# Patient Record
Sex: Female | Born: 1955 | Race: Black or African American | Hispanic: No | Marital: Married | Smoking: Current every day smoker
Health system: Southern US, Community
[De-identification: ages and names within clinical notes are randomized; demographics above are authoritative.]

---

## 2002-03-24 ENCOUNTER — Emergency Department (HOSPITAL_COMMUNITY): Admission: EM | Admit: 2002-03-24 | Discharge: 2002-03-24 | Payer: Self-pay | Admitting: Emergency Medicine

## 2002-03-24 ENCOUNTER — Encounter: Payer: Self-pay | Admitting: Emergency Medicine

## 2002-05-08 ENCOUNTER — Ambulatory Visit (HOSPITAL_COMMUNITY): Admission: RE | Admit: 2002-05-08 | Discharge: 2002-05-08 | Payer: Self-pay | Admitting: Family Medicine

## 2019-02-10 ENCOUNTER — Other Ambulatory Visit: Payer: Self-pay

## 2019-02-10 ENCOUNTER — Inpatient Hospital Stay (HOSPITAL_COMMUNITY): Payer: BC Managed Care – PPO

## 2019-02-10 ENCOUNTER — Emergency Department (HOSPITAL_COMMUNITY): Payer: BC Managed Care – PPO

## 2019-02-10 ENCOUNTER — Inpatient Hospital Stay (HOSPITAL_COMMUNITY)
Admission: EM | Admit: 2019-02-10 | Discharge: 2019-03-16 | DRG: 025 | Disposition: E | Payer: BC Managed Care – PPO | Attending: Neurological Surgery | Admitting: Neurological Surgery

## 2019-02-10 ENCOUNTER — Encounter (HOSPITAL_COMMUNITY): Payer: Self-pay | Admitting: Emergency Medicine

## 2019-02-10 DIAGNOSIS — E43 Unspecified severe protein-calorie malnutrition: Secondary | ICD-10-CM | POA: Insufficient documentation

## 2019-02-10 DIAGNOSIS — H5704 Mydriasis: Secondary | ICD-10-CM | POA: Diagnosis present

## 2019-02-10 DIAGNOSIS — G939 Disorder of brain, unspecified: Secondary | ICD-10-CM

## 2019-02-10 DIAGNOSIS — Z66 Do not resuscitate: Secondary | ICD-10-CM | POA: Diagnosis present

## 2019-02-10 DIAGNOSIS — R200 Anesthesia of skin: Secondary | ICD-10-CM | POA: Diagnosis present

## 2019-02-10 DIAGNOSIS — R739 Hyperglycemia, unspecified: Secondary | ICD-10-CM | POA: Diagnosis not present

## 2019-02-10 DIAGNOSIS — G9752 Postprocedural hemorrhage and hematoma of a nervous system organ or structure following other procedure: Secondary | ICD-10-CM | POA: Diagnosis not present

## 2019-02-10 DIAGNOSIS — G9349 Other encephalopathy: Secondary | ICD-10-CM | POA: Diagnosis not present

## 2019-02-10 DIAGNOSIS — M21371 Foot drop, right foot: Secondary | ICD-10-CM | POA: Diagnosis not present

## 2019-02-10 DIAGNOSIS — Z8679 Personal history of other diseases of the circulatory system: Secondary | ICD-10-CM

## 2019-02-10 DIAGNOSIS — J95821 Acute postprocedural respiratory failure: Secondary | ICD-10-CM | POA: Diagnosis not present

## 2019-02-10 DIAGNOSIS — D1800 Hemangioma unspecified site: Secondary | ICD-10-CM | POA: Diagnosis present

## 2019-02-10 DIAGNOSIS — D62 Acute posthemorrhagic anemia: Secondary | ICD-10-CM | POA: Diagnosis not present

## 2019-02-10 DIAGNOSIS — G9389 Other specified disorders of brain: Secondary | ICD-10-CM

## 2019-02-10 DIAGNOSIS — R030 Elevated blood-pressure reading, without diagnosis of hypertension: Secondary | ICD-10-CM | POA: Diagnosis not present

## 2019-02-10 DIAGNOSIS — C801 Malignant (primary) neoplasm, unspecified: Secondary | ICD-10-CM | POA: Diagnosis present

## 2019-02-10 DIAGNOSIS — J9600 Acute respiratory failure, unspecified whether with hypoxia or hypercapnia: Secondary | ICD-10-CM

## 2019-02-10 DIAGNOSIS — C7931 Secondary malignant neoplasm of brain: Principal | ICD-10-CM | POA: Diagnosis present

## 2019-02-10 DIAGNOSIS — Z818 Family history of other mental and behavioral disorders: Secondary | ICD-10-CM

## 2019-02-10 DIAGNOSIS — Z4659 Encounter for fitting and adjustment of other gastrointestinal appliance and device: Secondary | ICD-10-CM

## 2019-02-10 DIAGNOSIS — Z1159 Encounter for screening for other viral diseases: Secondary | ICD-10-CM

## 2019-02-10 DIAGNOSIS — R531 Weakness: Secondary | ICD-10-CM | POA: Diagnosis not present

## 2019-02-10 DIAGNOSIS — Z515 Encounter for palliative care: Secondary | ICD-10-CM

## 2019-02-10 DIAGNOSIS — F17210 Nicotine dependence, cigarettes, uncomplicated: Secondary | ICD-10-CM | POA: Diagnosis present

## 2019-02-10 DIAGNOSIS — C7800 Secondary malignant neoplasm of unspecified lung: Secondary | ICD-10-CM | POA: Diagnosis present

## 2019-02-10 DIAGNOSIS — G936 Cerebral edema: Secondary | ICD-10-CM | POA: Diagnosis present

## 2019-02-10 DIAGNOSIS — I10 Essential (primary) hypertension: Secondary | ICD-10-CM | POA: Diagnosis not present

## 2019-02-10 DIAGNOSIS — J9690 Respiratory failure, unspecified, unspecified whether with hypoxia or hypercapnia: Secondary | ICD-10-CM

## 2019-02-10 DIAGNOSIS — J439 Emphysema, unspecified: Secondary | ICD-10-CM | POA: Diagnosis present

## 2019-02-10 DIAGNOSIS — T380X5A Adverse effect of glucocorticoids and synthetic analogues, initial encounter: Secondary | ICD-10-CM

## 2019-02-10 DIAGNOSIS — E876 Hypokalemia: Secondary | ICD-10-CM | POA: Diagnosis not present

## 2019-02-10 DIAGNOSIS — E878 Other disorders of electrolyte and fluid balance, not elsewhere classified: Secondary | ICD-10-CM | POA: Diagnosis not present

## 2019-02-10 DIAGNOSIS — G9340 Encephalopathy, unspecified: Secondary | ICD-10-CM

## 2019-02-10 DIAGNOSIS — D496 Neoplasm of unspecified behavior of brain: Secondary | ICD-10-CM | POA: Diagnosis present

## 2019-02-10 DIAGNOSIS — Z9911 Dependence on respirator [ventilator] status: Secondary | ICD-10-CM

## 2019-02-10 DIAGNOSIS — Z9889 Other specified postprocedural states: Secondary | ICD-10-CM

## 2019-02-10 DIAGNOSIS — G935 Compression of brain: Secondary | ICD-10-CM | POA: Diagnosis present

## 2019-02-10 LAB — COMPREHENSIVE METABOLIC PANEL
ALT: 17 U/L (ref 0–44)
AST: 22 U/L (ref 15–41)
Albumin: 3.3 g/dL — ABNORMAL LOW (ref 3.5–5.0)
Alkaline Phosphatase: 62 U/L (ref 38–126)
Anion gap: 10 (ref 5–15)
BUN: 8 mg/dL (ref 8–23)
CO2: 22 mmol/L (ref 22–32)
Calcium: 8.9 mg/dL (ref 8.9–10.3)
Chloride: 109 mmol/L (ref 98–111)
Creatinine, Ser: 0.64 mg/dL (ref 0.44–1.00)
GFR calc Af Amer: >60 mL/min (ref >60)
GFR calc non Af Amer: >60 mL/min (ref >60)
Glucose, Bld: 124 mg/dL — ABNORMAL HIGH (ref 70–99)
Potassium: 3.5 mmol/L (ref 3.5–5.1)
Sodium: 141 mmol/L (ref 135–145)
Total Bilirubin: 0.7 mg/dL (ref 0.3–1.2)
Total Protein: 6.9 g/dL (ref 6.5–8.1)

## 2019-02-10 LAB — PROTIME-INR
INR: 1.1 (ref 0.8–1.2)
Prothrombin Time: 14.5 seconds (ref 11.4–15.2)

## 2019-02-10 LAB — CBC
HCT: 42.4 % (ref 36.0–46.0)
Hemoglobin: 14 g/dL (ref 12.0–15.0)
MCH: 32.8 pg (ref 26.0–34.0)
MCHC: 33 g/dL (ref 30.0–36.0)
MCV: 99.3 fL (ref 80.0–100.0)
Platelets: 328 10*3/uL (ref 150–400)
RBC: 4.27 MIL/uL (ref 3.87–5.11)
RDW: 14.6 % (ref 11.5–15.5)
WBC: 8.3 10*3/uL (ref 4.0–10.5)
nRBC: 0 % (ref 0.0–0.2)

## 2019-02-10 LAB — DIFFERENTIAL
Abs Immature Granulocytes: 0.03 10*3/uL (ref 0.00–0.07)
Basophils Absolute: 0.1 10*3/uL (ref 0.0–0.1)
Basophils Relative: 1 %
Eosinophils Absolute: 0.2 10*3/uL (ref 0.0–0.5)
Eosinophils Relative: 3 %
Immature Granulocytes: 0 %
Lymphocytes Relative: 26 %
Lymphs Abs: 2.2 10*3/uL (ref 0.7–4.0)
Monocytes Absolute: 0.8 10*3/uL (ref 0.1–1.0)
Monocytes Relative: 9 %
Neutro Abs: 5.1 10*3/uL (ref 1.7–7.7)
Neutrophils Relative %: 61 %

## 2019-02-10 LAB — URINALYSIS, ROUTINE W REFLEX MICROSCOPIC
Bilirubin Urine: NEGATIVE
Glucose, UA: NEGATIVE mg/dL
Hgb urine dipstick: NEGATIVE
Ketones, ur: NEGATIVE mg/dL
Leukocytes,Ua: NEGATIVE
Nitrite: NEGATIVE
Protein, ur: NEGATIVE mg/dL
Specific Gravity, Urine: 1.014 (ref 1.005–1.030)
pH: 7 (ref 5.0–8.0)

## 2019-02-10 LAB — ETHANOL: Alcohol, Ethyl (B): <10 mg/dL (ref <10)

## 2019-02-10 LAB — RAPID URINE DRUG SCREEN, HOSP PERFORMED
Amphetamines: NOT DETECTED
Barbiturates: NOT DETECTED
Benzodiazepines: NOT DETECTED
Cocaine: NOT DETECTED
Opiates: NOT DETECTED
Tetrahydrocannabinol: NOT DETECTED

## 2019-02-10 LAB — APTT: aPTT: 31 seconds (ref 24–36)

## 2019-02-10 MED ORDER — HEPARIN SODIUM (PORCINE) 5000 UNIT/ML IJ SOLN
5000.0000 [IU] | Freq: Three times a day (TID) | INTRAMUSCULAR | Status: DC
  Administered 2019-02-11 – 2019-02-14 (×7): 5000 [IU] via SUBCUTANEOUS
  Filled 2019-02-10 (×7): qty 1

## 2019-02-10 MED ORDER — SENNOSIDES-DOCUSATE SODIUM 8.6-50 MG PO TABS: 1.0000 | ORAL_TABLET | Freq: Every evening | ORAL | Status: DC | PRN

## 2019-02-10 MED ORDER — GADOBUTROL 1 MMOL/ML IV SOLN
4.5000 mL | Freq: Once | INTRAVENOUS | Status: AC | PRN
  Administered 2019-02-10: 4.5 mL via INTRAVENOUS

## 2019-02-10 MED ORDER — IOHEXOL 300 MG/ML  SOLN
100.0000 mL | Freq: Once | INTRAMUSCULAR | Status: AC | PRN
  Administered 2019-02-10: 100 mL via INTRAVENOUS

## 2019-02-10 MED ORDER — NICOTINE 21 MG/24HR TD PT24
21.0000 mg | MEDICATED_PATCH | Freq: Every day | TRANSDERMAL | Status: DC
  Administered 2019-02-10 – 2019-02-18 (×8): 21 mg via TRANSDERMAL
  Filled 2019-02-10 (×8): qty 1

## 2019-02-10 MED ORDER — PROMETHAZINE HCL 25 MG PO TABS: 12.5000 mg | ORAL_TABLET | Freq: Four times a day (QID) | ORAL | Status: DC | PRN

## 2019-02-10 MED ORDER — ACETAMINOPHEN 325 MG PO TABS: 650.0000 mg | ORAL_TABLET | Freq: Four times a day (QID) | ORAL | Status: DC | PRN

## 2019-02-10 MED ORDER — HEPARIN SODIUM (PORCINE) 5000 UNIT/ML IJ SOLN: 5000.0000 [IU] | Freq: Three times a day (TID) | INTRAMUSCULAR | Status: DC

## 2019-02-10 MED ORDER — ENOXAPARIN SODIUM 30 MG/0.3ML ~~LOC~~ SOLN
30.0000 mg | SUBCUTANEOUS | Status: DC
  Administered 2019-02-10: 30 mg via SUBCUTANEOUS
  Filled 2019-02-10: qty 0.3

## 2019-02-10 MED ORDER — ACETAMINOPHEN 650 MG RE SUPP: 650.0000 mg | Freq: Four times a day (QID) | RECTAL | Status: DC | PRN

## 2019-02-10 NOTE — ED Triage Notes (Signed)
Pt states she has been experiencing increased weakness especially in her right arm X 3 weeks .

## 2019-02-10 NOTE — ED Notes (Signed)
ED TO INPATIENT HANDOFF REPORT  ED Nurse Name and Phone #: Harriette Bouillon [phone]  S Name/Age/Gender Donna Delgado 63 y.o. female Room/Bed: 026C/026C  Code Status   Code Status: Full Code  Home/SNF/Other Home Patient oriented to: self, place, time and situation Is this baseline? Yes   Triage Complete: Triage complete  Chief Complaint Right sided weakness  Triage Note Pt states she has been experiencing increased weakness especially in her right arm X 3 weeks .   Allergies No Known Allergies  Level of Care/Admitting Diagnosis ED Disposition    ED Disposition Condition [facility] Area: [place] [[id number]]  Level of Care: Telemetry Medical [104]  Covid Evaluation: Screening Protocol (No Symptoms)  Diagnosis: Brain mass [694854]  Admitting Physician: Aldine Contes [[id number]]  Attending Physician: Aldine Contes [phone]  Estimated length of stay: past midnight tomorrow  Certification:: I certify this patient will need inpatient services for at least 2 midnights  PT Class (Do Not Modify): Inpatient [101]  PT Acc Code (Do Not Modify): Private [1]       B Medical/Surgery History History reviewed. No pertinent past medical history. History reviewed. No pertinent surgical history.   A IV Location/Drains/Wounds Patient Lines/Drains/Airways Status   Active Line/Drains/Airways    Name:   Placement date:   Placement time:   Site:   Days:   Peripheral IV 02/08/2019 Right Antecubital   01/20/2019    1033    Antecubital   less than 1          Intake/Output Last 24 hours No intake or output data in the 24 hours ending 01/24/2019 1519  Labs/Imaging Results for orders placed or performed during the hospital encounter of 01/26/2019 (from the past 48 hour(s))  Ethanol     Status: None   Collection Time: 01/15/2019 10:20 AM  Result Value Ref Range   Alcohol, Ethyl (B) <10 <10 mg/dL    Comment: (NOTE) Lowest detectable limit for serum  alcohol is 10 mg/dL. For medical purposes only. Performed at [facility], [place] [address] [place], [place] [address]   Protime-INR     Status: None   Collection Time: 02/12/2019 10:20 AM  Result Value Ref Range   Prothrombin Time 14.5 11.4 - 15.2 seconds   INR 1.1 0.8 - 1.2    Comment: (NOTE) INR goal varies based on device and disease states. Performed at [facility], [place] [address] [place], [place] [address]   APTT     Status: None   Collection Time: 02/09/2019 10:20 AM  Result Value Ref Range   aPTT 31 24 - 36 seconds    Comment: Performed at [facility] [address], [place], [place] [address]  CBC     Status: None   Collection Time: 01/17/2019 10:20 AM  Result Value Ref Range   WBC 8.3 4.0 - 10.5 K/uL   RBC 4.27 3.87 - 5.11 MIL/uL   Hemoglobin 14.0 12.0 - 15.0 g/dL   HCT 42.4 36.0 - 46.0 %   MCV 99.3 80.0 - 100.0 fL   MCH 32.8 26.0 - 34.0 pg   MCHC 33.0 30.0 - 36.0 g/dL   RDW 14.6 11.5 - 15.5 %   Platelets 328 150 - 400 K/uL   nRBC 0.0 0.0 - 0.2 %    Comment: Performed at [facility], [place] [address] [place], [place] [address]  Differential     Status: None  Collection Time: 01/22/2019 10:20 AM  Result Value Ref Range   Neutrophils Relative % 61 %   Neutro Abs 5.1 1.7 - 7.7 K/uL   Lymphocytes Relative 26 %   Lymphs Abs 2.2 0.7 - 4.0 K/uL   Monocytes Relative 9 %   Monocytes Absolute 0.8 0.1 - 1.0 K/uL   Eosinophils Relative 3 %   Eosinophils Absolute 0.2 0.0 - 0.5 K/uL   Basophils Relative 1 %   Basophils Absolute 0.1 0.0 - 0.1 K/uL   Immature Granulocytes 0 %   Abs Immature Granulocytes 0.03 0.00 - 0.07 K/uL    Comment: Performed at [facility] [address], [place], [place] [address]  Comprehensive metabolic panel     Status: Abnormal   Collection Time: 01/17/2019 10:20 AM  Result Value Ref Range   Sodium 141 135 - 145 mmol/L   Potassium 3.5 3.5 - 5.1 mmol/L   Chloride 109 98 - 111 mmol/L   CO2 22 22 - 32 mmol/L    Glucose, Bld 124 (H) 70 - 99 mg/dL   BUN 8 8 - 23 mg/dL   Creatinine, Ser 0.64 0.44 - 1.00 mg/dL   Calcium 8.9 8.9 - 10.3 mg/dL   Total Protein 6.9 6.5 - 8.1 g/dL   Albumin 3.3 (L) 3.5 - 5.0 g/dL   AST 22 15 - 41 U/L   ALT 17 0 - 44 U/L   Alkaline Phosphatase 62 38 - 126 U/L   Total Bilirubin 0.7 0.3 - 1.2 mg/dL   GFR calc non Af Amer >60 >60 mL/min   GFR calc Af Amer >60 >60 mL/min   Anion gap 10 5 - 15    Comment: Performed at [facility], [place] [address] [place], [place] [address]  Urine rapid drug screen (hosp performed)     Status: None   Collection Time: 02/04/2019  1:00 PM  Result Value Ref Range   Opiates NONE DETECTED NONE DETECTED   Cocaine NONE DETECTED NONE DETECTED   Benzodiazepines NONE DETECTED NONE DETECTED   Amphetamines NONE DETECTED NONE DETECTED   Tetrahydrocannabinol NONE DETECTED NONE DETECTED   Barbiturates NONE DETECTED NONE DETECTED    Comment: (NOTE) DRUG SCREEN FOR MEDICAL PURPOSES ONLY.  IF CONFIRMATION IS NEEDED FOR ANY PURPOSE, NOTIFY LAB WITHIN 5 DAYS. LOWEST DETECTABLE LIMITS FOR URINE DRUG SCREEN Drug Class                     Cutoff (ng/mL) Amphetamine and metabolites    1000 Barbiturate and metabolites    200 Benzodiazepine                 643 Tricyclics and metabolites     300 Opiates and metabolites        300 Cocaine and metabolites        300 THC                            50 Performed at [facility], [place] [address], [place], [place] [address]   Urinalysis, Routine w reflex microscopic     Status: None   Collection Time: 02/08/2019  1:00 PM  Result Value Ref Range   Color, Urine YELLOW YELLOW   APPearance CLEAR CLEAR   Specific Gravity, Urine 1.014 1.005 - 1.030   pH 7.0 5.0 - 8.0   Glucose, UA NEGATIVE NEGATIVE mg/dL   Hgb urine dipstick NEGATIVE NEGATIVE  Bilirubin Urine NEGATIVE NEGATIVE   Ketones, ur NEGATIVE NEGATIVE mg/dL   Protein, ur NEGATIVE NEGATIVE mg/dL   Nitrite NEGATIVE NEGATIVE   Leukocytes,Ua  NEGATIVE NEGATIVE    Comment: Performed at [facility] [address], [place], [place] [address]   Ct Head Wo Contrast  Result Date: 01/23/2019 CLINICAL DATA:  Increasing weakness, especially in RIGHT arm for 3 weeks. EXAM: CT HEAD WITHOUT CONTRAST TECHNIQUE: Contiguous axial images were obtained from the base of the skull through the vertex without intravenous contrast. COMPARISON:  None. FINDINGS: Brain: Large area of a is a Janik edema within the LEFT frontotemporal lobe. Associated mass effect as manifested by sulcal effacement, compression of the LEFT lateral ventricle and a minimal rightward midline shift which measures approximately 3 mm. Suspect associated uncal herniation on the LEFT. No evidence of transtentorial or tonsillar herniation. No parenchymal or extra-axial hemorrhage. Vascular: No hyperdense vessel or unexpected calcification. Skull: No acute or suspicious osseous lesion. Sinuses/Orbits: No acute findings. Other: None. IMPRESSION: 1. Large area of vasogenic appearing edema centered within the LEFT frontotemporal lobe, highly suggestive of neoplastic process, infarction considered less likely. Recommend brain MRI with contrast for further characterization. There is associated mass effect as manifested by sulcal effacement, compression of the LEFT lateral ventricle and minimal rightward midline shift. 2. No parenchymal or extra-axial hemorrhage. These results were called by telephone at the time of interpretation on 02/07/2019 at 11:12 am to Dr. Theotis Burrow , who verbally acknowledged these results. Electronically Signed   By: Franki Cabot M.D.   On: 02/06/2019 11:12   Mr Jeri Cos UE Contrast  Result Date: 01/21/2019 CLINICAL DATA:  Neoplasm of uncertain behavior. EXAM: MRI HEAD WITHOUT AND WITH CONTRAST TECHNIQUE: Multiplanar, multiecho pulse sequences of the brain and surrounding structures were obtained without and with intravenous contrast. CONTRAST:  4.5 cc Gadavist  intravenous COMPARISON:  Head CT from earlier today FINDINGS: Brain: Avidly enhancing mass showing peripheral cellularity on T2 and diffusion imaging and central heterogeneous enhancement from necrosis, enhancing area measuring 3.4 cm. Prominent feeding vessels from the surface of the brain and from the ventricular margin. There is extensive swelling and white matter edema with local mass effect and 4 mm of midline shift. The mass is centered in the posterior frontal lobe, subcortical. Chronic blood products associated with the mass. No acute hemorrhage, infarct, or hydrocephalus. Vascular: Major flow voids and vascular enhancements are preserved Skull and upper cervical spine: Negative for marrow lesion Sinuses/Orbits: Opacified right anterior ethmoid sinus. IMPRESSION: 3.4 cm mass in the posterior left frontal white matter extensive adjacent swelling and 4 mm of midline shift. This could reflect solitary metastasis or glioblastoma. At time of biopsy please note prominent feeding vessels along the deep surface arising from the ventricle. Electronically Signed   By: Monte Fantasia M.D.   On: 02/09/2019 12:42    Pending Labs Unresulted Labs (From admission, onward)    Start     Ordered   02/11/19 4540  Basic metabolic panel  Tomorrow morning,   R     02/04/2019 1450   02/11/19 0500  CBC  Tomorrow morning,   R     02/05/2019 1450   01/20/2019 1449  HIV antibody (Routine Testing)  Once,   STAT     02/08/2019 1450   01/27/2019 1021  Novel Coronavirus,NAA,(SEND-OUT TO REF LAB - TAT 24-48 hrs); Hosp Order  (Asymptomatic Patients Labs)  Once,   STAT    Question:  Rule Out  Answer:  Yes   01/17/2019 1020          Vitals/Pain Today's Vitals   02/11/2019 1300 01/31/2019 1313 02/06/2019 1330 01/27/2019 1345  BP: (!) 178/124 (!) 165/101 131/83 (!) 149/86  Pulse: 71  77 (!) 58  Resp: 16 19 17  (!) 21  Temp:      TempSrc:      SpO2: 100% 100% 98% 97%  Weight:      Height:      PainSc:        Isolation  Precautions No active isolations  Medications Medications  enoxaparin (LOVENOX) injection 40 mg (has no administration in time range)  acetaminophen (TYLENOL) tablet 650 mg (has no administration in time range)    Or  acetaminophen (TYLENOL) suppository 650 mg (has no administration in time range)  senna-docusate (Senokot-S) tablet 1 tablet (has no administration in time range)  promethazine (PHENERGAN) tablet 12.5 mg (has no administration in time range)  gadobutrol (GADAVIST) 1 MMOL/ML injection 4.5 mL (4.5 mLs Intravenous Contrast Given 01/21/2019 1227)    Mobility walks Low fall risk   Focused Assessments Neuro Assessment Handoff:  Swallow screen pass? Yes    NIH Stroke Scale ( + Modified Stroke Scale Criteria)  Interval: Shift assessment Level of Consciousness (1a.)   : Alert, keenly responsive LOC Questions (1b. )   +: Answers both questions correctly LOC Commands (1c. )   + : Performs both tasks correctly Best Gaze (2. )  +: Normal Visual (3. )  +: No visual loss Facial Palsy (4. )    : Normal symmetrical movements Motor Arm, Left (5a. )   +: No drift Motor Arm, Right (5b. )   +: No drift Motor Leg, Left (6a. )   +: No drift Motor Leg, Right (6b. )   +: Drift Limb Ataxia (7. ): Absent Sensory (8. )   +: Normal, no sensory loss Best Language (9. )   +: No aphasia Dysarthria (10. ): Normal Extinction/Inattention (11.)   +: No Abnormality Modified SS Total  +: 0 Complete NIHSS TOTAL: 0     Neuro Assessment: Exceptions to WDL Neuro Checks:   Initial (01/20/2019 1007)  Last Documented NIHSS Modified Score: 0 (01/26/2019 1007) Has TPA been given? No If patient is a Neuro Trauma and patient is going to OR before floor call report to [place] nurse: [phone] or [phone]     R Recommendations: See Admitting Provider Note  Report given to:   Additional Notes:

## 2019-02-10 NOTE — ED Notes (Signed)
Brother- [facility], [phone]

## 2019-02-10 NOTE — ED Notes (Signed)
Attempted to call report. Nurse off floor, will call back

## 2019-02-10 NOTE — ED Notes (Signed)
Back from MRI.

## 2019-02-10 NOTE — H&P (Signed)
Date: 01/27/2019               Patient Name:  [name] MRN: [id number]  DOB: 06-Jun-1956 Age / Sex: 63 y.o., female   PCP: Patient, No Pcp Per         Medical Service: Internal Medicine Teaching Service         Attending Physician: Dr. Aldine Contes, MD    First Contact: Dr. Sherry Ruffing Pager: [phone]  Second Contact: Dr. Maricela Bo  Pager: [phone]       After Hours (After 5p/  First Contact Pager: [phone]  weekends / holidays): Second Contact Pager: [phone]   Chief Complaint: R arm weakness   History of Present Illness: Donna Delgado is a 63 year old female with no past medical history who presents to the ED with a 3-week history of R arm weakness. She first noticed she was dropping objects and fumbling about 4 months ago.  She has been busy taking care of her mother who passed away recently from COVID-31 as well as her father who has moderate dementia and has not been able to be evaluated for this. Three weeks ago her symptoms started to worsen and she is now barely able to lift her right arm or hold onto objects with her right hand.  She is also having difficulty lifting her right foot when ambulating and reports 2 recent falls at home.  She has also noticed word finding difficulty that started 4 months ago.  This appears to be stable and she has not noticed any worsening in her speech recently.  She denies fever, chills, recent illness, sick contacts, headaches, changes in vision, dysarthria, difficulty swallowing, changes in sensation, chest pain, shortness of breath, cough, abdominal pain, N/V, and changes in urination and bowel movements.  On arrival to the ED she was hemodynamically stable.  Her blood work was unremarkable.  Head CT showed a left frontotemporal low mass with a large area of vasogenic appearing edema and associated mass-effect.  Neurosurgery was consulted who recommended and metastatic work-up with CT chest, abdomen, and pelvis.  She did not receive any  medications in the ED.  Meds:  Current Meds  Medication Sig  . acetaminophen (TYLENOL) 500 MG tablet Take 1,000 mg by mouth every 6 (six) hours as needed for mild pain or headache.    Allergies: Allergies as of 01/23/2019  . (No Known Allergies)   History reviewed. No pertinent past medical history.  Family History:  Dementia in father.   Social History: Patient lives at home with his elderly father who has moderate dementia.  Her and her daughter are his only caretakers.  Her mother died recently in 01/01/2023 from COVID-19.  Works at [employer] but currently only for absence to take care of her parents.  She is planning to return to work tomorrow.  She endorses alcohol use, about 3-4 beers daily.  Last drink was yesterday.  Denies history of withdrawals.  She also smokes 1 to 2 packs/day since 1974.  She denies illicit drug use.  She does not have a PCP and has not seen a doctor in over 15 years.  Review of Systems: A complete ROS was negative except as per HPI.  Physical Exam: Blood pressure (!) 171/91, pulse 60, temperature 98.3 F (36.8 C), temperature source Oral, resp. rate 16, height 5\' 3"  (1.6 m), weight 45.4 kg, SpO2 100 %.  Physical Exam  Constitutional: She is oriented to person, place, and time and  well-developed, well-nourished, and in no distress. No distress.  HENT:  Head: Normocephalic and atraumatic.  Mouth/Throat: Oropharynx is clear and moist. No oropharyngeal exudate.  Eyes: Pupils are equal, round, and reactive to light. Conjunctivae and EOM are normal.  Neck: Normal range of motion. Neck supple.  Cardiovascular: Normal rate, regular rhythm and normal heart sounds. Exam reveals no gallop and no friction rub.  No murmur heard. Pulmonary/Chest: Effort normal and breath sounds normal. No respiratory distress. She has no wheezes. She has no rales.  Abdominal: Soft. Bowel sounds are normal. She exhibits no distension. There is no abdominal tenderness.  Musculoskeletal:  Normal range of motion.        General: No tenderness, deformity or edema.  Lymphadenopathy:    She has no cervical adenopathy.  Neurological: She is alert and oriented to person, place, and time. She has normal reflexes. She displays weakness (4/4 strenth on RUE with R wrist drop, 4/5 on R foot extention and flexion ) and abnormal speech (Word finding difficulty ). She displays facial symmetry. No sensory deficit. She shows no pronator drift.  Skin: Skin is warm. No rash noted.  Psychiatric: Affect normal.    MRI brain: 3.4 cm mass in the posterior left frontal white matter extensive adjacent swelling and 4 mm of midline shift. This could reflect solitary metastasis or glioblastoma. At time of biopsy please note prominent feeding vessels along the deep surface arising from the ventricle.   Assessment & Plan by Problem: Active Problems:   Brain mass  Left frontotemporal mass: Donna Delgado is presenting with progressive worsening of right arm and foot weakness and was found to have a 3.4 cm left frontal mass with associated vasogenic edema and midline shift highly suggestive of malignancy. Neurosurgery was consulted in the ED who recommended a metastatic work-up with CT chest, abdomen, and pelvis to identify a possible primary malignancy.  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  No indication for high-dose steroids at this time given chronicity of symptoms without acute worsening.  Currently pending further evaluation by neurosurgery to determine surgical options. - Neurosurgery following, appreciate recommendations - SQ heparin for VTE ppx in anticipation for possible biopsy - Hold off on steroids at this time  - PT/OT   Diet: Regular  VTE ppx: SQ heparin Code status: FULL CODE, discussed on admission  Daughter [name] [phone]   Dispo: Admit patient to  Inpatient with expected length of stay greater than 2 midnights.  Signed: Asencion Noble, MD 01/31/2019, 4:37 PM  Pager: [phone]

## 2019-02-10 NOTE — ED Notes (Signed)
Donna Delgado Father [phone] home number

## 2019-02-10 NOTE — ED Notes (Signed)
Pt returned from CT °

## 2019-02-10 NOTE — ED Notes (Signed)
Pt aware we need urine sample, unable to go at this time 

## 2019-02-10 NOTE — ED Provider Notes (Signed)
[facility] EMERGENCY DEPARTMENT Provider Note   CSN: [id number] Arrival date & time: 01/31/2019  1000     History   Chief Complaint Chief Complaint  Patient presents with  . Weakness    HPI Donna Delgado is a 63 y.o. female.     63yo F who denies PMH p/w R arm weakness. Pt states that at the beginning of the year, she noticed she was dropping things with her right hand sometimes. Over the past 3 weeks, her R hand and arm weakness have progressively worsened. She has noticed some problems with her right leg, sometimes dragging or tripping with her right foot. Occasional balance problems. She denies headache, vision changes, numbness, recent trauma, or neck pain. She has not seen a doctor in years.   The history is provided by the patient.  Weakness   No past medical history on file.  There are no active problems to display for this patient.   History reviewed. No pertinent surgical history.   OB History   No obstetric history on file.    PMH: Denies  PSH: denies  Home Medications    Prior to Admission medications   Not on File  None  Family History No family history on file.  Social History Social History   Tobacco Use  . Smoking status: Not on file  Substance Use Topics  . Alcohol use: Not on file  . Drug use: Not on file  smokes 1PPD cigarettes 3-4 alcoholic drinks daily Denies illicit drug use   Allergies   Patient has no allergy information on record. NKDA  Review of Systems Review of Systems  Neurological: Positive for weakness.   All other systems reviewed and are negative except that which was mentioned in HPI  Physical Exam Updated Vital Signs BP (!) 175/97 (BP Location: Left Arm)   Pulse 84   Temp 98.6 F (37 C) (Oral)   Resp 16   Ht 5\' 3"  (1.6 m)   Wt 45.4 kg   SpO2 98%   BMI 17.71 kg/m   Physical Exam Vitals signs and nursing note reviewed.  Constitutional:      General: She is not in acute  distress.    Appearance: She is well-developed.     Comments: Awake, alert  HENT:     Head: Normocephalic and atraumatic.  Eyes:     Extraocular Movements: Extraocular movements intact.     Conjunctiva/sclera: Conjunctivae normal.     Pupils: Pupils are equal, round, and reactive to light.  Neck:     Musculoskeletal: Neck supple.  Cardiovascular:     Rate and Rhythm: Normal rate and regular rhythm.     Heart sounds: Normal heart sounds. No murmur.  Pulmonary:     Effort: Pulmonary effort is normal. No respiratory distress.     Breath sounds: Normal breath sounds.  Abdominal:     General: Bowel sounds are normal. There is no distension.     Palpations: Abdomen is soft.     Tenderness: There is no abdominal tenderness.  Musculoskeletal: Normal range of motion.     Comments: Some muscle atrophy of R forearm and hand muscles  Skin:    General: Skin is warm and dry.  Neurological:     Mental Status: She is alert and oriented to person, place, and time.     Cranial Nerves: Cranial nerves are intact. No cranial nerve deficit.     Sensory: No sensory deficit.     Motor:  Weakness, atrophy and pronator drift present.     Coordination: Finger-Nose-Finger Test abnormal.     Deep Tendon Reflexes: Reflexes are normal and symmetric.     Comments: Fluent speech, dysmetria R; R pronator drift;  RUE: 4/5 strength biceps/triceps, shoulder; 3/5 grip strength LUE, LLE: 5/5 strength RLE: 4/5 strength straight leg raise, dorsiflexion/plantarflexion No clonus  Psychiatric:        Thought Content: Thought content normal.        Judgment: Judgment normal.      ED Treatments / Results  Labs (all labs ordered are listed, but only abnormal results are displayed) Labs Reviewed  COMPREHENSIVE METABOLIC PANEL - Abnormal; Notable for the following components:      Result Value   Glucose, Bld 124 (*)    Albumin 3.3 (*)    All other components within normal limits  NOVEL CORONAVIRUS, NAA (HOSPITAL  ORDER, SEND-OUT TO REF LAB)  ETHANOL  PROTIME-INR  APTT  CBC  DIFFERENTIAL  RAPID URINE DRUG SCREEN, HOSP PERFORMED  URINALYSIS, ROUTINE W REFLEX MICROSCOPIC  HIV ANTIBODY (ROUTINE TESTING W REFLEX)    EKG    Radiology Ct Head Wo Contrast  Result Date: 01/24/2019 CLINICAL DATA:  Increasing weakness, especially in RIGHT arm for 3 weeks. EXAM: CT HEAD WITHOUT CONTRAST TECHNIQUE: Contiguous axial images were obtained from the base of the skull through the vertex without intravenous contrast. COMPARISON:  None. FINDINGS: Brain: Large area of a is a Janik edema within the LEFT frontotemporal lobe. Associated mass effect as manifested by sulcal effacement, compression of the LEFT lateral ventricle and a minimal rightward midline shift which measures approximately 3 mm. Suspect associated uncal herniation on the LEFT. No evidence of transtentorial or tonsillar herniation. No parenchymal or extra-axial hemorrhage. Vascular: No hyperdense vessel or unexpected calcification. Skull: No acute or suspicious osseous lesion. Sinuses/Orbits: No acute findings. Other: None. IMPRESSION: 1. Large area of vasogenic appearing edema centered within the LEFT frontotemporal lobe, highly suggestive of neoplastic process, infarction considered less likely. Recommend brain MRI with contrast for further characterization. There is associated mass effect as manifested by sulcal effacement, compression of the LEFT lateral ventricle and minimal rightward midline shift. 2. No parenchymal or extra-axial hemorrhage. These results were called by telephone at the time of interpretation on 01/31/2019 at 11:12 am to Dr. Theotis Burrow , who verbally acknowledged these results. Electronically Signed   By: Franki Cabot M.D.   On: 01/19/2019 11:12   Mr Jeri Cos IZ Contrast  Result Date: 01/18/2019 CLINICAL DATA:  Neoplasm of uncertain behavior. EXAM: MRI HEAD WITHOUT AND WITH CONTRAST TECHNIQUE: Multiplanar, multiecho pulse sequences of  the brain and surrounding structures were obtained without and with intravenous contrast. CONTRAST:  4.5 cc Gadavist intravenous COMPARISON:  Head CT from earlier today FINDINGS: Brain: Avidly enhancing mass showing peripheral cellularity on T2 and diffusion imaging and central heterogeneous enhancement from necrosis, enhancing area measuring 3.4 cm. Prominent feeding vessels from the surface of the brain and from the ventricular margin. There is extensive swelling and white matter edema with local mass effect and 4 mm of midline shift. The mass is centered in the posterior frontal lobe, subcortical. Chronic blood products associated with the mass. No acute hemorrhage, infarct, or hydrocephalus. Vascular: Major flow voids and vascular enhancements are preserved Skull and upper cervical spine: Negative for marrow lesion Sinuses/Orbits: Opacified right anterior ethmoid sinus. IMPRESSION: 3.4 cm mass in the posterior left frontal white matter extensive adjacent swelling and 4 mm of midline shift. This  could reflect solitary metastasis or glioblastoma. At time of biopsy please note prominent feeding vessels along the deep surface arising from the ventricle. Electronically Signed   By: Monte Fantasia M.D.   On: 02/09/2019 12:42    Procedures Procedures (including critical care time)  Medications Ordered in ED Medications  enoxaparin (LOVENOX) injection 40 mg (has no administration in time range)  acetaminophen (TYLENOL) tablet 650 mg (has no administration in time range)    Or  acetaminophen (TYLENOL) suppository 650 mg (has no administration in time range)  senna-docusate (Senokot-S) tablet 1 tablet (has no administration in time range)  promethazine (PHENERGAN) tablet 12.5 mg (has no administration in time range)  gadobutrol (GADAVIST) 1 MMOL/ML injection 4.5 mL (4.5 mLs Intravenous Contrast Given 01/28/2019 1227)     Initial Impression / Assessment and Plan / ED Course  I have reviewed the triage  vital signs and the nursing notes.  Pertinent labs & imaging results that were available during my care of the patient were reviewed by me and considered in my medical decision making (see chart for details).       Concern for subacute stroke or  brain mass.  Lab work unremarkable.  CT shows findings concerning for left frontal mass with edema.  Obtained MRI with contrast which shows a large mass with 4 mm midline shift and extensive vasogenic edema.  Discussed with neurosurgery, Dr. Zada Finders, who will see in consultation and recommended holding off on steroids for now.  Discussed admission with internal medicine teaching service and patient admitted for further work-up and treatment.  Final Clinical Impressions(s) / ED Diagnoses   Final diagnoses:  Frontal mass of brain  Right sided weakness    ED Discharge Orders    None       Little, Wenda Overland, MD 01/23/2019 1541

## 2019-02-10 NOTE — ED Notes (Signed)
Patient transported to CT 

## 2019-02-10 NOTE — Consult Note (Signed)
Neurosurgery Consultation  Reason for Consult: Brain tumor Referring Physician: Dareen Piano  CC: Right sided weakness  HPI: This is a 63 y.o. woman that presents with 4 months of progressive R sided weakness and numbness with word finding difficulty. She has been taking care of her parents and therefore did not seek medical attention until now. No seizure-like episodes, no notable increase in headaches. No h/o known malignancy, no fam h/o intracranial tumors.   ROS: A 14 point ROS was performed and is negative except as noted in the HPI.   PMHx: History reviewed. No pertinent past medical history. FamHx: No family history on file. SocHx:  has no history on file for tobacco, alcohol, and drug.  Exam: Vital signs in last 24 hours: Temp:  [98.2 F (36.8 C)-99.1 F (37.3 C)] 98.9 F (37.2 C) (06/29 0306) Pulse Rate:  [58-84] 75 (06/29 0306) Resp:  [16-24] 16 (06/28 1627) BP: (131-178)/(83-124) 176/92 (06/29 0306) SpO2:  [97 %-100 %] 100 % (06/29 0306) Weight:  [45.4 kg] 45.4 kg (06/28 1015) General: Awake, alert, cooperative, lying in bed in NAD Head: normocephalic and atruamatic HEENT: neck supple Pulmonary: breathing room air comfortably, no evidence of increased work of breathing Cardiac: RRR Abdomen: S NT ND Extremities: warm and well perfused x4 Neuro: AOx3, PERRL, EOMI, FS, speech fluent with intermittent word finding difficulty Strength 5/5 on left, 3/5 on right, R hemianesthesia  Assessment and Plan: 63 y.o. woman with progressive R sided weakness. MRI personally reviewed, which shows a left frontoparietal enhancing mass with brain compression and significant surrounding vasogenic edema, high vascularity with significant deep feeders. .  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

## 2019-02-10 NOTE — ED Notes (Signed)
Patient transported to MRI 

## 2019-02-11 ENCOUNTER — Encounter (HOSPITAL_COMMUNITY): Payer: Self-pay

## 2019-02-11 DIAGNOSIS — F17210 Nicotine dependence, cigarettes, uncomplicated: Secondary | ICD-10-CM | POA: Diagnosis not present

## 2019-02-11 DIAGNOSIS — G939 Disorder of brain, unspecified: Secondary | ICD-10-CM | POA: Diagnosis not present

## 2019-02-11 DIAGNOSIS — R531 Weakness: Secondary | ICD-10-CM | POA: Diagnosis not present

## 2019-02-11 LAB — NOVEL CORONAVIRUS, NAA (HOSP ORDER, SEND-OUT TO REF LAB; TAT 18-24 HRS): SARS-CoV-2, NAA: NOT DETECTED

## 2019-02-11 LAB — HIV ANTIBODY (ROUTINE TESTING W REFLEX): HIV Screen 4th Generation wRfx: NONREACTIVE

## 2019-02-11 MED ORDER — DEXAMETHASONE SODIUM PHOSPHATE 10 MG/ML IJ SOLN
10.0000 mg | Freq: Four times a day (QID) | INTRAMUSCULAR | Status: AC
  Administered 2019-02-11 – 2019-02-13 (×7): 10 mg via INTRAVENOUS
  Filled 2019-02-11 (×7): qty 1

## 2019-02-11 NOTE — Progress Notes (Signed)
   Subjective:   Donna Delgado was seen resting in her bed this morning stating that she was doing well. She stated that she feels tired.   Objective:  Vital signs in last 24 hours: Vitals:   01/17/2019 1627 01/18/2019 1957 01/19/2019 2332 02/11/19 0306  BP: (!) 171/91 (!) 156/83 (!) 151/89 (!) 176/92  Pulse: 60 79 67 75  Resp: 16     Temp: 98.3 F (36.8 C) 98.2 F (36.8 C) 99.1 F (37.3 C) 98.9 F (37.2 C)  TempSrc: Oral Oral Oral Oral  SpO2: 100% 100% 99% 100%  Weight:      Height:       Physical Exam  Constitutional: Appears well-developed and well-nourished. No distress.  HENT:  Head: Normocephalic and atraumatic.  Eyes: Conjunctivae are normal.  Cardiovascular: Normal rate, regular rhythm and normal heart sounds.  Respiratory: Effort normal and breath sounds normal. No respiratory distress. No wheezes.  GI: Soft. Bowel sounds are normal. No distension. There is no tenderness.  Musculoskeletal: No edema.  Neurological: Is alert, oriented, sensation intact, CN2-12 intact, 3/5 strength in right upper extremity and right lower extremity distally 4/5 strength otherwise within normal range Skin: Not diaphoretic. No erythema.  Psychiatric: Normal mood and affect. Behavior is normal. Judgment and thought content normal.   Assessment/Plan:  Active Problems:   Brain mass  Left frontotemporal mass  MRI showing 3.4cm left frontal mass with vasogenic edema. CT CAP did not show any site of metastatic lesions.   Neurosurgery to remove mass and biopsy on Friday 7/3. Will speak to neurosurgeon if it can be done earlier or as an outpatient.   -Avoid decadron at this time  -frequent neuro checks  -PT/OT evaluation  -Sq heparin   Tobacco abuse disorder Current smoker 1.5 ppd  -continue nicotine patch   Dispo: Anticipated discharge in approximately  day(s).   Lars Mage, MD Internal Medicine PGY2 [id number]:[phone] 02/11/2019, 8:36 AM

## 2019-02-11 NOTE — Progress Notes (Signed)
Inpatient Rehabilitation Admissions Coordinator  Inpatient rehab consult received. Noted OR pending. We will follow up postoperatively for rehab assessment.  Danne Baxter, RN, MSN Rehab Admissions Coordinator [phone] 02/11/2019 3:44 PM

## 2019-02-11 NOTE — Progress Notes (Signed)
  Date: 02/11/2019  Patient name: Donna Delgado  Medical record number: [id number]  Date of birth: 04-26-1956   I have seen and evaluated Donna Delgado and discussed their care with the Residency Team.  In brief, patient is a 63 year old female with no past medical history who presented to the ED with a 3-week history of right sided weakness.  Patient states that approximately 3 weeks ago she noted right upper extremity weakness.  Over the last couple of months patient has noted that she was dropping things and fumbling with her right hand but over the last 3 weeks she has noted worsening of the symptoms.  Patient was barely able to lift her right arm or hold objects with her right hand.  Patient also reports right lower extremity weakness and has difficulty lifting her right foot and had 2 recent falls at home.  No fevers or chills, no lightheadedness, no syncope, no chest pain, no shortness of breath, no diaphoresis, no palpitations, no headache, no blurry vision, no tingling or numbness, no difficulty swallowing, no bowel or bladder incontinence, no nausea or vomiting, no abdominal pain, no diarrhea.  Today patient has persistent right upper and lower extremity weakness but denies any other complaints at this time.  PMHx, Fam Hx, and/or Soc Hx : As per resident admit note  Vitals:   02/11/19 0755 02/11/19 1200  BP: (!) 143/91 124/88  Pulse: 77 81  Resp: 16 18  Temp:    SpO2: 99%    Physical Exam  Constitutional: She is oriented to person, place, and time and well-developed, well-nourished, and in no distress.  HENT:  Head: Normocephalic and atraumatic.  Eyes: Right eye exhibits no discharge. Left eye exhibits no discharge.  Cardiovascular: Normal rate, regular rhythm and normal heart sounds.  Pulmonary/Chest: Effort normal and breath sounds normal. No respiratory distress. She has no wheezes.  Musculoskeletal:        General: No tenderness or edema.  Neurological: She is alert  and oriented to person, place, and time.  Power is 3+ out of 5 in right upper extremity as well as 4 out of 5 in right lower extremity compared to 5 out of 5 in her left upper and lower extremity.  Sensation is intact, extraocular movements intact  Skin: Skin is warm and dry.  Psychiatric: Mood and affect normal.    Assessment and Plan: I have seen and evaluated the patient as outlined above. I agree with the formulated Assessment and Plan as detailed in the residents' note, with the following changes:   1.  Left frontal mass: -Patient presented to the ED with progressive weakness in her right upper and lower extremities over the last 3 weeks.  Imaging showed a 3.4 cm left frontal mass with vasogenic edema and a 4 mm midline shift consistent with a solitary met versus primary brain tumor. -PT/OT follow-up recommendations appreciated.  Recommend CIR evaluation -CT chest/abdomen/pelvis with no evidence of a primary tumor or mets -Neurosurgery to evaluate the patient.  Per discussion with RN neurosurgery evaluated the patient earlier this morning and will take the patient for removal of mass this week (likely Wednesday) -We will hold off on Decadron at this time as it may affect the pathology results -No further work-up at this time  Aldine Contes, MD 6/29/20202:23 PM

## 2019-02-11 NOTE — Progress Notes (Signed)
Chaplain rec'd referral from case manager.  First time chaplain came  Staff present  in room and she did not stay. The second time the chaplain came by the patient said she was tired and trying to take a nap. Very pleasantly she said she needed rest. Chaplain will continue to follow. Tamsen Snider Pager [phone]

## 2019-02-11 NOTE — Evaluation (Signed)
Physical Therapy Evaluation Patient Details Name: Donna Delgado MRN: [id number] DOB: Jun 13, 1956 Today's Date: 02/11/2019   History of Present Illness  Patient is a 63 year old female. She presented to the hospital following a 2 week on set of right UE and LE weakness. She feels like she keeps tripping over her toe. She iis losing fucntional use of her right hand. 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 PMH: smoker and ETOH use   Clinical Impression  Patient presents with decreased balance and endurance with gait. She had a right foot drop which increased as she ambulated. She reports at home she has nearly tripped on her stairs several times. At home she is the primary caregiver for her father. She also has a flight of stairs to get inside her house. At this time she may benefit most from CIR for intensive rehab to help her return to high level of prior function. She will have a craniotomy on 02/13/2019. Therapy will likely have to re-assess plan at that time. If she continues to have right sided foot drop she may benefit from an AFO as well as a cane for balance. Therapy will continue to follow and will update discharge recommendations when MD feels she is ready after her surgery.     Follow Up Recommendations CIR;Home health PT    Equipment Recommendations  (will trial with a cane; may also need AFO )    Recommendations for Other Services Rehab consult     Precautions / Restrictions Precautions Precautions: Fall Precaution Comments: reprots she has nearly tripped over her toe on several occasions  Restrictions Weight Bearing Restrictions: No      Mobility  Bed Mobility Overal bed mobility: Independent             General bed mobility comments: able to sit up on the edge of the bed but had some difficulty getting right LE out of bed   Transfers Overall transfer level: Needs assistance Equipment used: None Transfers: Sit to/from Stand Sit to Stand: Min  guard         General transfer comment: min gaurd for balance.  Ambulation/Gait Ambulation/Gait assistance: Min guard Gait Distance (Feet): 50 Feet Assistive device: None Gait Pattern/deviations: Step-to pattern;Decreased step length - right;Decreased stance time - right Gait velocity: decreased   General Gait Details: right toe drag, decreased right hip flexion ; As patient fatigued her deficits increased and her safety decreased.   Stairs            Wheelchair Mobility    Modified Rankin (Stroke Patients Only) Modified Rankin (Stroke Patients Only) Pre-Morbid Rankin Score: No symptoms Modified Rankin: Slight disability     Balance Overall balance assessment: Needs assistance Sitting-balance support: Feet supported;No upper extremity supported Sitting balance-Leahy Scale: Good     Standing balance support: No upper extremity supported Standing balance-Leahy Scale: Fair Standing balance comment: needs gaurding 2nd to right foot drop                              Pertinent Vitals/Pain Pain Assessment: No/denies pain    Home Living Family/patient expects to be discharged to:: Private residence Living Arrangements: Parent Available Help at Discharge: Family Type of Home: House Home Access: Stairs to enter Entrance Stairs-Rails: Can reach both Entrance Stairs-Number of Steps: 12 Home Layout: Two level   Additional Comments: Patient lives with father who has dementia     Prior  Function Level of Independence: Independent         Comments: was not using AD. Was the primary caregiver for father and mother  who passed reccently      Hand Dominance   Dominant Hand: Right    Extremity/Trunk Assessment   Upper Extremity Assessment Upper Extremity Assessment: Defer to OT evaluation(significant right UE noted )    Lower Extremity Assessment Lower Extremity Assessment: RLE deficits/detail RLE Deficits / Details: right knee extension 4/5 right  ankle DF 3/5; right hip flexion 4/5     Cervical / Trunk Assessment Cervical / Trunk Assessment: Normal  Communication   Communication: No difficulties  Cognition Arousal/Alertness: Awake/alert Behavior During Therapy: WFL for tasks assessed/performed Overall Cognitive Status: Within Functional Limits for tasks assessed                                        General Comments      Exercises     Assessment/Plan    PT Assessment Patient needs continued PT services  PT Problem List Decreased strength;Decreased activity tolerance;Decreased balance;Decreased mobility;Decreased coordination;Decreased knowledge of use of DME       PT Treatment Interventions DME instruction;Gait training;Functional mobility training;Stair training;Therapeutic activities;Therapeutic exercise;Neuromuscular re-education;Patient/family education    PT Goals (Current goals can be found in the Care Plan section)  Acute Rehab PT Goals Patient Stated Goal: to get stronger  PT Goal Formulation: With patient Time For Goal Achievement: 02/18/19 Potential to Achieve Goals: Good    Frequency Min 3X/week   Barriers to discharge        Co-evaluation               AM-PAC PT "6 Clicks" Mobility  Outcome Measure Help needed turning from your back to your side while in a flat bed without using bedrails?: A Little Help needed moving from lying on your back to sitting on the side of a flat bed without using bedrails?: A Little Help needed moving to and from a bed to a chair (including a wheelchair)?: A Lot Help needed standing up from a chair using your arms (e.g., wheelchair or bedside chair)?: A Lot Help needed to walk in hospital room?: A Lot Help needed climbing 3-5 steps with a railing? : A Lot 6 Click Score: 14    End of Session Equipment Utilized During Treatment: Gait belt Activity Tolerance: Patient tolerated treatment well Patient left: in chair;with call bell/phone within  reach Nurse Communication: Mobility status PT Visit Diagnosis: Other abnormalities of gait and mobility (R26.89);Unsteadiness on feet (R26.81);Muscle weakness (generalized) (M62.81)    Time: 1040-1101 PT Time Calculation (min) (ACUTE ONLY): 21 min   Charges:   PT Evaluation $PT Eval Moderate Complexity: 1 Mod            Carney Living PT DPT  02/11/2019, 1:38 PM

## 2019-02-11 NOTE — TOC Initial Note (Addendum)
Transition of Care [facility]) - Initial/Assessment Note    Patient Details  Name: Donna Delgado MRN: [id number] Date of Birth: 05/21/1956  [facility]) CM/SW Contact:    Pollie Friar, RN Phone Number: 02/11/2019, 1:26 PM  Clinical Narrative:                 Pt states her father can provide 24 hour supervision and some physical assistance. Denies issues with home meds or with transportation. Doesn't have a PCP. Would like assistance prior to d/c in finding one. Awaiting Crani on Wednesday and will follow for PT/OT evals.   Expected Discharge Plan: Home/Self Care Barriers to Discharge: Continued Medical Work up   Patient Goals and CMS Choice        Expected Discharge Plan and Services Expected Discharge Plan: Home/Self Care       Living arrangements for the past 2 months: Single Family Home(split level)                                      Prior Living Arrangements/Services Living arrangements for the past 2 months: Single Family Home(split level) Lives with:: Parents(father) Patient language and need for interpreter reviewed:: Yes(no needs) Do you feel safe going back to the place where you live?: Yes            Criminal Activity/Legal Involvement Pertinent to Current Situation/Hospitalization: No - Comment as needed  Activities of Daily Living Home Assistive Devices/Equipment: None ADL Screening (condition at time of admission) Patient's cognitive ability adequate to safely complete daily activities?: Yes Is the patient deaf or have difficulty hearing?: No Does the patient have difficulty seeing, even when wearing glasses/contacts?: No Does the patient have difficulty concentrating, remembering, or making decisions?: No Patient able to express need for assistance with ADLs?: Yes Does the patient have difficulty dressing or bathing?: Yes Independently performs ADLs?: Yes (appropriate for developmental age) Does the patient have difficulty  walking or climbing stairs?: Yes Weakness of Legs: Both Weakness of Arms/Hands: Right  Permission Sought/Granted                  Emotional Assessment Appearance:: Appears stated age Attitude/Demeanor/Rapport: Engaged Affect (typically observed): Accepting, Pleasant Orientation: : Oriented to Self, Oriented to Place, Oriented to  Time, Oriented to Situation   Psych Involvement: No (comment)  Admission diagnosis:  Frontal mass of brain [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 Brain mass 02/12/2019   PCP:  Patient, No Pcp Per Pharmacy:   [facility] [address] ([address]), [facility] [place] [address] [place] ([place]) [place] [address] Phone: [phone] Fax: [phone]     Social Determinants of Health (SDOH) Interventions    Readmission Risk Interventions No flowsheet data found.

## 2019-02-11 NOTE — Evaluation (Signed)
Occupational Therapy Evaluation Patient Details Name: Donna Delgado MRN: [id number] DOB: 1955-09-04 Today's Date: 02/11/2019    History of Present Illness Patient is a 63 year old female. She presented to the hospital following a 2 week on set of right UE and LE weakness. She feels like she keeps tripping over her toe. She iis losing fucntional use of her right hand. 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 PMH: smoker and ETOH use    Clinical Impression   Patient is pending surgery 02/13/19  resulting in functional limitations due to the deficits listed below (see OT problem list). Pt currently min (A) for basic transfers and previously main caregiver for elderly father. Pt needs to return to MOD I level. Question cognition and need for SLP consult after 02/13/19. Patient will benefit from skilled OT acutely to increase independence and safety with ADLS to allow discharge CIR.     Follow Up Recommendations  CIR    Equipment Recommendations  Other (comment)(TBA after 02/13/19)    Recommendations for Other Services Speech consult(after procedure 02/13/19 for cogntion)     Precautions / Restrictions Precautions Precautions: Fall Precaution Comments: reprots she has nearly tripped over her toe on several occasions  Restrictions Weight Bearing Restrictions: No      Mobility Bed Mobility Overal bed mobility: Independent             General bed mobility comments: in chair on arrival. pt states "this chair is more comfortable"   Transfers Overall transfer level: Needs assistance Equipment used: None Transfers: Sit to/from Stand Sit to Stand: Min assist         General transfer comment: require Min (A) power up . question fatigue with OT and PT sessions back to back    Balance Overall balance assessment: Needs assistance Sitting-balance support: Feet supported;No upper extremity supported Sitting balance-Leahy Scale: Good     Standing balance support:  No upper extremity supported Standing balance-Leahy Scale: Fair Standing balance comment: needs gaurding 2nd to right foot drop                            ADL either performed or assessed with clinical judgement   ADL Overall ADL's : Needs assistance/impaired     Grooming: Oral care;Wash/dry face;Moderate assistance;Standing Grooming Details (indicate cue type and reason): pt attempting to brush teeth with mask on even looking in the mirror. Question further need for cognitive assessment based on this error without correction                 Toilet Transfer: Minimal assistance           Functional mobility during ADLs: Minimal assistance General ADL Comments: pt reports driving to [place] to empty out mothers apartment after her death. Pt drove with deficits and states "i dont know how i did it but i did"      Vision Baseline Vision/History: Wears glasses       Perception     Praxis      Pertinent Vitals/Pain Pain Assessment: No/denies pain     Hand Dominance Right   Extremity/Trunk Assessment Upper Extremity Assessment Upper Extremity Assessment: RUE deficits/detail RUE Deficits / Details: shoulder flexion AROM 90 degrees with effort, wrist extension with cues, due to wrist flexion maintains a flexed digit posture, pt able to make a fist very loose (3 out 5 strength) supination WFL with decreased pronation, elbow flexion WFL. pt attempting to  use L UE now instead of R UE but is R UE dominant RUE Sensation: WNL RUE Coordination: decreased fine motor;decreased gross motor   Lower Extremity Assessment Lower Extremity Assessment: Defer to PT evaluation RLE Deficits / Details: right knee extension 4/5 right ankle DF 3/5; right hip flexion 4/5    Cervical / Trunk Assessment Cervical / Trunk Assessment: Normal   Communication Communication Communication: No difficulties   Cognition Arousal/Alertness: Awake/alert Behavior During Therapy: WFL for  tasks assessed/performed Overall Cognitive Status: Impaired/Different from baseline                                 General Comments: errors with oral care without self correction. OT to further assess   General Comments  question need for R UE after wednesday procedure-- continue to assess    Exercises     Shoulder Instructions      Home Living Family/patient expects to be discharged to:: Private residence Living Arrangements: Parent Available Help at Discharge: Family Type of Home: House Home Access: Stairs to enter Technical brewer of Steps: 12 Entrance Stairs-Rails: Can reach both Home Layout: Two level Alternate Level Stairs-Number of Steps: 12   Bathroom Shower/Tub: Teacher, early years/pre: Standard         Additional Comments: Patient lives with father who has dementia and he drives. pt has a daughter that can give PRN (A) only due to working      Prior Functioning/Environment Level of Independence: Independent        Comments: was not using AD. Was the primary caregiver for father and mother  who passed reccently         OT Problem List: Decreased strength;Decreased activity tolerance;Impaired balance (sitting and/or standing);Decreased cognition;Decreased safety awareness;Decreased knowledge of use of DME or AE;Decreased knowledge of precautions;Impaired UE functional use;Decreased range of motion;Decreased coordination      OT Treatment/Interventions: Self-care/ADL training;Therapeutic exercise;Neuromuscular education;Energy conservation;DME and/or AE instruction;Manual therapy;Modalities;Therapeutic activities;Splinting;Cognitive remediation/compensation;Patient/family education;Balance training    OT Goals(Current goals can be found in the care plan section) Acute Rehab OT Goals Patient Stated Goal: to return home to father OT Goal Formulation: With patient Time For Goal Achievement: 02/25/19 Potential to Achieve Goals:  Good  OT Frequency: Min 2X/week   Barriers to D/C: Decreased caregiver support          Co-evaluation              AM-PAC OT "6 Clicks" Daily Activity     Outcome Measure Help from another person eating meals?: A Little Help from another person taking care of personal grooming?: A Little Help from another person toileting, which includes using toliet, bedpan, or urinal?: A Little Help from another person bathing (including washing, rinsing, drying)?: A Little Help from another person to put on and taking off regular upper body clothing?: A Little Help from another person to put on and taking off regular lower body clothing?: A Little 6 Click Score: 18   End of Session Equipment Utilized During Treatment: Gait belt Nurse Communication: Mobility status;Precautions  Activity Tolerance: Patient tolerated treatment well Patient left: in chair;with call bell/phone within reach;with chair alarm set  OT Visit Diagnosis: Unsteadiness on feet (R26.81);Muscle weakness (generalized) (M62.81)                Time: 1610-9604 OT Time Calculation (min): 16 min Charges:  OT General Charges $OT Visit: 1 Visit OT Evaluation $OT Eval  Moderate Complexity: 1 Mod   Jeri Modena, OTR/L  Acute Rehabilitation Services Pager: [phone] Office: [phone] .   Jeri Modena 02/11/2019, 2:01 PM

## 2019-02-11 NOTE — Plan of Care (Signed)
Progressing towards goals

## 2019-02-12 ENCOUNTER — Inpatient Hospital Stay (HOSPITAL_COMMUNITY): Payer: BC Managed Care – PPO

## 2019-02-12 DIAGNOSIS — T380X5A Adverse effect of glucocorticoids and synthetic analogues, initial encounter: Secondary | ICD-10-CM

## 2019-02-12 DIAGNOSIS — R531 Weakness: Secondary | ICD-10-CM | POA: Diagnosis not present

## 2019-02-12 DIAGNOSIS — R739 Hyperglycemia, unspecified: Secondary | ICD-10-CM

## 2019-02-12 DIAGNOSIS — F17210 Nicotine dependence, cigarettes, uncomplicated: Secondary | ICD-10-CM | POA: Diagnosis not present

## 2019-02-12 DIAGNOSIS — Z8679 Personal history of other diseases of the circulatory system: Secondary | ICD-10-CM

## 2019-02-12 DIAGNOSIS — G939 Disorder of brain, unspecified: Secondary | ICD-10-CM | POA: Diagnosis not present

## 2019-02-12 LAB — GLUCOSE, CAPILLARY
Glucose-Capillary: 151 mg/dL — ABNORMAL HIGH (ref 70–99)
Glucose-Capillary: 227 mg/dL — ABNORMAL HIGH (ref 70–99)
Glucose-Capillary: 135 mg/dL — ABNORMAL HIGH (ref 70–99)
Glucose-Capillary: 263 mg/dL — ABNORMAL HIGH (ref 70–99)

## 2019-02-12 LAB — BASIC METABOLIC PANEL WITH GFR
Anion gap: 9 (ref 5–15)
BUN: 9 mg/dL (ref 8–23)
CO2: 25 mmol/L (ref 22–32)
Calcium: 8.9 mg/dL (ref 8.9–10.3)
Chloride: 105 mmol/L (ref 98–111)
Creatinine, Ser: 0.74 mg/dL (ref 0.44–1.00)
GFR calc Af Amer: >60 mL/min
GFR calc non Af Amer: >60 mL/min
Glucose, Bld: 154 mg/dL — ABNORMAL HIGH (ref 70–99)
Potassium: 3.8 mmol/L (ref 3.5–5.1)
Sodium: 139 mmol/L (ref 135–145)

## 2019-02-12 LAB — CBC
HCT: 43.8 % (ref 36.0–46.0)
Hemoglobin: 14.3 g/dL (ref 12.0–15.0)
MCH: 32.4 pg (ref 26.0–34.0)
MCHC: 32.6 g/dL (ref 30.0–36.0)
MCV: 99.1 fL (ref 80.0–100.0)
Platelets: 346 10*3/uL (ref 150–400)
RBC: 4.42 MIL/uL (ref 3.87–5.11)
RDW: 14.6 % (ref 11.5–15.5)
WBC: 7 10*3/uL (ref 4.0–10.5)
nRBC: 0 % (ref 0.0–0.2)

## 2019-02-12 MED ORDER — PANTOPRAZOLE SODIUM 40 MG PO TBEC
40.0000 mg | DELAYED_RELEASE_TABLET | Freq: Every day | ORAL | Status: DC
  Administered 2019-02-12 – 2019-02-13 (×2): 40 mg via ORAL
  Filled 2019-02-12 (×2): qty 1

## 2019-02-12 MED ORDER — INSULIN ASPART 100 UNIT/ML ~~LOC~~ SOLN
0.0000 [IU] | Freq: Three times a day (TID) | SUBCUTANEOUS | Status: DC
  Administered 2019-02-12 – 2019-02-14 (×3): 1 [IU] via SUBCUTANEOUS

## 2019-02-12 MED ORDER — INSULIN GLARGINE 100 UNIT/ML ~~LOC~~ SOLN
5.0000 [IU] | Freq: Every day | SUBCUTANEOUS | Status: DC
  Administered 2019-02-12: 5 [IU] via SUBCUTANEOUS
  Filled 2019-02-12 (×2): qty 0.05

## 2019-02-12 MED ORDER — LABETALOL HCL 5 MG/ML IV SOLN: 5.0000 mg | INTRAVENOUS | Status: DC | PRN

## 2019-02-12 MED ORDER — INSULIN ASPART 100 UNIT/ML ~~LOC~~ SOLN
0.0000 [IU] | Freq: Every day | SUBCUTANEOUS | Status: DC
  Administered 2019-02-12: 3 [IU] via SUBCUTANEOUS

## 2019-02-12 MED ORDER — GADOBUTROL 1 MMOL/ML IV SOLN
5.0000 mL | Freq: Once | INTRAVENOUS | Status: AC | PRN
  Administered 2019-02-12: 5 mL via INTRAVENOUS

## 2019-02-12 NOTE — Progress Notes (Signed)
   Subjective: Patient was seen and evaluated at bedside on morning rounds. No acute events overnight. She is doing well, eating breakfast. Dose not have any complaint.  Objective:  Vital signs in last 24 hours: Vitals:   02/11/19 2304 02/12/19 0304 02/12/19 0745 02/12/19 1145  BP: (!) 160/80 (!) 172/87 137/85 139/77  Pulse: 63 61 66 71  Resp: 18 18 15 15   Temp: 98.9 F (37.2 C) 98.7 F (37.1 C) 98.4 F (36.9 C) 98.6 F (37 C)  TempSrc: Oral Oral Oral Oral  SpO2: 100% 99% 99% 99%  Weight:      Height:        Physical Exam:  VS reviewed, nursing notes reviewed. General: Eyes: Extraocular movement normal CV: RRR, normal S1S2, no murmur Pulm:CTA bilaterally, no crackle Abdomen: Soft and nontender to palpation Neurologic exam: Alert and oriented x3, cranial nerves I to XII are intact, station is intact, motor strength: 5 out of 5 at left upper and lower extremity.  3-4/5 at right upper and right lower extremities Psychiatric: Normal mood and affect. Behavior is normal. Judgment and thought content normal.   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 CT CAP did not show any site of metastatic lesions.   Neurosurgery to remove mass and biopsy on Friday 7/3. Will speak to neurosurgeon if it can be done earlier or as an outpatient.   -Neurosurgery started her on decadron  -Planned for brain surgery tomorrow. NPO after midnight -frequent neuro checks  -PT/OT evaluation -->recommended CIR -Started with SSI, Lantus 5 unit QD and will do CBG monitoring while on Steroid -PRN labetalol for HTN while on steroid -PPI while on steroid  Tobacco abuse disorder Current smoker 1.5 ppd -continue nicotine patch  Dispo: Anticipated discharge depend on clinical stability after surgery  Dewayne Hatch, MD 02/12/2019, 2:48 PM Pager: [phone]

## 2019-02-12 NOTE — Progress Notes (Signed)
Inpatient Rehabilitation Admissions Coordinator  Inpatient rehab consult received. I met with patient at bedside. Noted surgery for 7/1. We will follow up postoperatively to assess rehab venue needs pending her postoperative therapy evaluations.  Danne Baxter, RN, MSN Rehab Admissions Coordinator [phone] 02/12/2019 12:34 PM

## 2019-02-12 NOTE — Progress Notes (Addendum)
Neurosurgery Service Progress Note  Subjective: No acute events overnight, no new complaints, running a little more HTNive but no significant side effects from steroids, hasn't noticed a significant difference in speech / strength.    Objective: Vitals:   02/12/19 0304 02/12/19 0745 02/12/19 1145 02/12/19 1609  BP: (!) 172/87 137/85 139/77 (!) 148/72  Pulse: 61 66 71 75  Resp: 18 15 15 14   Temp: 98.7 F (37.1 C) 98.4 F (36.9 C) 98.6 F (37 C) 99 F (37.2 C)  TempSrc: Oral Oral Oral Oral  SpO2: 99% 99% 99% 100%  Weight:      Height:       Temp (24hrs), Avg:98.8 F (37.1 C), Min:98.4 F (36.9 C), Max:99 F (37.2 C)  CBC Latest Ref Rng & Units 02/12/2019 01/24/2019  WBC 4.0 - 10.5 K/uL 7.0 8.3  Hemoglobin 12.0 - 15.0 g/dL 14.3 14.0  Hematocrit 36.0 - 46.0 % 43.8 42.4  Platelets 150 - 400 K/uL 346 328   BMP Latest Ref Rng & Units 02/12/2019 02/03/2019  Glucose 70 - 99 mg/dL 154(H) 124(H)  BUN 8 - 23 mg/dL 9 8  Creatinine 0.44 - 1.00 mg/dL 0.74 0.64  Sodium 135 - 145 mmol/L 139 141  Potassium 3.5 - 5.1 mmol/L 3.8 3.5  Chloride 98 - 111 mmol/L 105 109  CO2 22 - 32 mmol/L 25 22  Calcium 8.9 - 10.3 mg/dL 8.9 8.9    Intake/Output Summary (Last 24 hours) at 02/12/2019 1826 Last data filed at 02/12/2019 1330 Gross per 24 hour  Intake 702 ml  Output -  Net 702 ml    Current Facility-Administered Medications:  .  acetaminophen (TYLENOL) tablet 650 mg, 650 mg, Oral, Q6H PRN **OR** acetaminophen (TYLENOL) suppository 650 mg, 650 mg, Rectal, Q6H PRN, Chundi, Vahini, MD .  dexamethasone (DECADRON) injection 10 mg, 10 mg, Intravenous, Q6H, Chundi, Vahini, MD, 10 mg at 02/12/19 1801 .  heparin injection 5,000 Units, 5,000 Units, Subcutaneous, Q8H, Aldine Contes, MD, 5,000 Units at 02/12/19 1518 .  insulin aspart (novoLOG) injection 0-5 Units, 0-5 Units, Subcutaneous, QHS, Santos-Sanchez, Idalys, MD .  insulin aspart (novoLOG) injection 0-9 Units, 0-9 Units, Subcutaneous, TID WC,  Santos-Sanchez, Idalys, MD, 1 Units at 02/12/19 1801 .  insulin glargine (LANTUS) injection 5 Units, 5 Units, Subcutaneous, QHS, Santos-Sanchez, Idalys, MD .  labetalol (NORMODYNE) injection 5 mg, 5 mg, Intravenous, Q2H PRN, Santos-Sanchez, Idalys, MD .  nicotine (NICODERM CQ - dosed in mg/24 hours) patch 21 mg, 21 mg, Transdermal, Daily, Santos-Sanchez, Idalys, MD, 21 mg at 02/12/19 1120 .  pantoprazole (PROTONIX) EC tablet 40 mg, 40 mg, Oral, Daily, Santos-Sanchez, Idalys, MD, 40 mg at 02/12/19 1154 .  senna-docusate (Senokot-S) tablet 1 tablet, 1 tablet, Oral, QHS PRN, Chundi, Vahini, MD   Physical Exam: AOx3, PERRL, EOMI, FS, speech fluent with intermittent word finding difficulty Strength 5/5 on left, 3/5 on right, R hemianesthesia  Assessment and Plan: 63 y.o. woman with progressive R sided weakness. MRI personally reviewed, which shows a left frontoparietal enhancing mass with brain compression and significant surrounding vasogenic edema, high vascularity with significant deep feeders.  -unfortunately, due to scheduling and equipment issues, we've had to reschedule her surgery for Friday 7/3 at 07:30. Given that it's a holiday, we shouldn't have any of these issues. I let her know and we discussed surgery again -will need volumetric imaging prior to OR, will order a new MRI brain with volumetric T1 w/ contrast to use in the OR for frameless stereotaxy -given the delay,  please let me know if you would like me to transfer her to my service  Judith Part  02/12/19 6:26 PM

## 2019-02-12 NOTE — Plan of Care (Signed)
Progressing towards goals

## 2019-02-13 DIAGNOSIS — D72829 Elevated white blood cell count, unspecified: Secondary | ICD-10-CM

## 2019-02-13 DIAGNOSIS — G939 Disorder of brain, unspecified: Secondary | ICD-10-CM | POA: Diagnosis not present

## 2019-02-13 DIAGNOSIS — R739 Hyperglycemia, unspecified: Secondary | ICD-10-CM

## 2019-02-13 DIAGNOSIS — R030 Elevated blood-pressure reading, without diagnosis of hypertension: Secondary | ICD-10-CM

## 2019-02-13 LAB — CBC
HCT: 40.7 % (ref 36.0–46.0)
Hemoglobin: 13.6 g/dL (ref 12.0–15.0)
MCH: 33 pg (ref 26.0–34.0)
MCHC: 33.4 g/dL (ref 30.0–36.0)
MCV: 98.8 fL (ref 80.0–100.0)
Platelets: 351 10*3/uL (ref 150–400)
RBC: 4.12 MIL/uL (ref 3.87–5.11)
RDW: 14.6 % (ref 11.5–15.5)
WBC: 17 10*3/uL — ABNORMAL HIGH (ref 4.0–10.5)
nRBC: 0 % (ref 0.0–0.2)

## 2019-02-13 LAB — BASIC METABOLIC PANEL
Anion gap: 8 (ref 5–15)
BUN: 14 mg/dL (ref 8–23)
CO2: 26 mmol/L (ref 22–32)
Calcium: 9.1 mg/dL (ref 8.9–10.3)
Chloride: 107 mmol/L (ref 98–111)
Creatinine, Ser: 0.72 mg/dL (ref 0.44–1.00)
GFR calc Af Amer: >60 mL/min (ref >60)
GFR calc non Af Amer: >60 mL/min (ref >60)
Glucose, Bld: 140 mg/dL — ABNORMAL HIGH (ref 70–99)
Potassium: 4.8 mmol/L (ref 3.5–5.1)
Sodium: 141 mmol/L (ref 135–145)

## 2019-02-13 LAB — GLUCOSE, CAPILLARY
Glucose-Capillary: 120 mg/dL — ABNORMAL HIGH (ref 70–99)
Glucose-Capillary: 123 mg/dL — ABNORMAL HIGH (ref 70–99)
Glucose-Capillary: 143 mg/dL — ABNORMAL HIGH (ref 70–99)
Glucose-Capillary: 164 mg/dL — ABNORMAL HIGH (ref 70–99)

## 2019-02-13 LAB — MRSA PCR SCREENING: MRSA by PCR: NEGATIVE

## 2019-02-13 MED ORDER — DEXAMETHASONE SODIUM PHOSPHATE 10 MG/ML IJ SOLN
10.0000 mg | Freq: Four times a day (QID) | INTRAMUSCULAR | Status: AC
  Administered 2019-02-13 – 2019-02-14 (×6): 10 mg via INTRAVENOUS
  Filled 2019-02-13 (×5): qty 1

## 2019-02-13 NOTE — Progress Notes (Signed)
Neurosurgery Service Progress Note  Subjective: No acute events overnight, no new complaints  Objective: Vitals:   02/13/19 0000 02/13/19 0329 02/13/19 0330 02/13/19 0836  BP: 140/68 (!) 171/87  133/76  Pulse: 71 73  61  Resp: 18 (!) 23 17 18   Temp: 98.4 F (36.9 C) 98.4 F (36.9 C)  97.8 F (36.6 C)  TempSrc: Oral Oral  Oral  SpO2: 100% 100%  100%  Weight:      Height:       Temp (24hrs), Avg:98.5 F (36.9 C), Min:97.8 F (36.6 C), Max:99 F (37.2 C)  CBC Latest Ref Rng & Units 02/13/2019 02/12/2019 01/15/2019  WBC 4.0 - 10.5 K/uL 17.0(H) 7.0 8.3  Hemoglobin 12.0 - 15.0 g/dL 13.6 14.3 14.0  Hematocrit 36.0 - 46.0 % 40.7 43.8 42.4  Platelets 150 - 400 K/uL 351 346 328   BMP Latest Ref Rng & Units 02/13/2019 02/12/2019 01/27/2019  Glucose 70 - 99 mg/dL 140(H) 154(H) 124(H)  BUN 8 - 23 mg/dL 14 9 8   Creatinine 0.44 - 1.00 mg/dL 0.72 0.74 0.64  Sodium 135 - 145 mmol/L 141 139 141  Potassium 3.5 - 5.1 mmol/L 4.8 3.8 3.5  Chloride 98 - 111 mmol/L 107 105 109  CO2 22 - 32 mmol/L 26 25 22   Calcium 8.9 - 10.3 mg/dL 9.1 8.9 8.9    Intake/Output Summary (Last 24 hours) at 02/13/2019 1029 Last data filed at 02/12/2019 1800 Gross per 24 hour  Intake 480 ml  Output -  Net 480 ml    Current Facility-Administered Medications:  .  acetaminophen (TYLENOL) tablet 650 mg, 650 mg, Oral, Q6H PRN **OR** acetaminophen (TYLENOL) suppository 650 mg, 650 mg, Rectal, Q6H PRN, Chundi, Vahini, MD .  dexamethasone (DECADRON) injection 10 mg, 10 mg, Intravenous, Q6H, Chundi, Vahini, MD, 10 mg at 02/13/19 8502 .  dexamethasone (DECADRON) injection 10 mg, 10 mg, Intravenous, Q6H, Santos-Sanchez, Idalys, MD .  heparin injection 5,000 Units, 5,000 Units, Subcutaneous, Q8H, Dareen Piano, Nischal, MD, 5,000 Units at 02/12/19 2230 .  insulin aspart (novoLOG) injection 0-5 Units, 0-5 Units, Subcutaneous, QHS, Santos-Sanchez, Idalys, MD, 3 Units at 02/12/19 2231 .  insulin aspart (novoLOG) injection 0-9 Units, 0-9  Units, Subcutaneous, TID WC, Santos-Sanchez, Idalys, MD, 1 Units at 02/12/19 1801 .  insulin glargine (LANTUS) injection 5 Units, 5 Units, Subcutaneous, QHS, Santos-Sanchez, Idalys, MD, 5 Units at 02/12/19 2231 .  labetalol (NORMODYNE) injection 5 mg, 5 mg, Intravenous, Q2H PRN, Santos-Sanchez, Idalys, MD .  nicotine (NICODERM CQ - dosed in mg/24 hours) patch 21 mg, 21 mg, Transdermal, Daily, Santos-Sanchez, Idalys, MD, 21 mg at 02/13/19 1019 .  pantoprazole (PROTONIX) EC tablet 40 mg, 40 mg, Oral, Daily, Santos-Sanchez, Idalys, MD, 40 mg at 02/13/19 1019 .  senna-docusate (Senokot-S) tablet 1 tablet, 1 tablet, Oral, QHS PRN, Chundi, Vahini, MD   Physical Exam: AOx3, PERRL, EOMI, FS, speech fluent with intermittent word finding difficulty Strength 5/5 on left, 3/5 on right, R hemianesthesia  Assessment and Plan: 63 y.o. woman with progressive R sided weakness. MRI personally reviewed, which shows a left frontoparietal enhancing mass with brain compression and significant surrounding vasogenic edema, high vascularity with significant deep feeders.  -OR 7/2 vs 7/3, OR working on scheduling currently -vMRI done yesterday, no significant changes in lesion, scan is good enough quality for intra-op navigation -we discussed surgery and post-op expectations a bit more. Given her atrophy on the R, I am very pessimistic about regaining strength, likely will have some worsened dysphasia post-op but hopefully  not permanent. I told her there's a significant chance that I have to leave the the inferomedial component of the tumor capsule due to risk of WM tracts (SLF / IFOF / PLIC / etc). -given the delay, please let me know if you would like me to transfer her to my service  Judith Part  02/13/19 10:29 AM

## 2019-02-13 NOTE — Progress Notes (Signed)
   Subjective: Donna Delgado is doing great after another day of steroids. She states that she feels stronger today. She has no pain or concerns. She is anxious to have surgery.  Objective:  Vital signs in last 24 hours: Vitals:   02/12/19 2048 02/13/19 0000 02/13/19 0329 02/13/19 0330  BP: (!) 159/103 140/68 (!) 171/87   Pulse: 72 71 73   Resp: (!) 23 18 (!) 23 17  Temp: 98.7 F (37.1 C) 98.4 F (36.9 C) 98.4 F (36.9 C)   TempSrc: Oral Oral Oral   SpO2: 100% 100% 100%   Weight:      Height:       Physical Exam  Constitutional: She is oriented to person, place, and time. No distress.  HENT:  Head: Normocephalic and atraumatic.  Mouth/Throat: Oropharynx is clear and moist.  Eyes: Pupils are equal, round, and reactive to light. EOM are normal.  Neck: Normal range of motion.  Cardiovascular: Normal rate, regular rhythm, normal heart sounds and intact distal pulses. Exam reveals no friction rub.  No murmur heard. Pulmonary/Chest: Effort normal and breath sounds normal. No respiratory distress. She has no wheezes.  Abdominal: Soft.  Musculoskeletal: Normal range of motion.  Neurological: She is alert and oriented to person, place, and time. No cranial nerve deficit.  Decreased strength in RUE, 3/5  Skin: Skin is warm and dry. She is not diaphoretic.  Nursing note and vitals reviewed.   Assessment/Plan:  Principal Problem:   Brain mass - scheduled for NSU tomorrow - continue dexamethasone 10 mg q6hrs per NSU - NPO at midnight - d/c evening long acting insulin   Active Problems:   Steroid-induced hyperglycemia and leukocytosis  -titrate insulin accordingly -elevated WBC since starting steroids but otherwise no fever or infx sx    History of elevated blood pressure while in hospital -has prn labetalol ordered but with surgery coming up, with not start a scheduled bp med at this time    Dispo: Anticipated discharge once stable after surgery.  Al Decant, MD  02/13/2019, 5:58 AM Pager: [phone]

## 2019-02-13 NOTE — Progress Notes (Signed)
Physical Therapy Treatment Patient Details Name: Donna Delgado MRN: [id number] DOB: 26-Jan-1956 Today's Date: 02/13/2019    History of Present Illness Patient is a 63 year old female. She presented to the hospital following a 2 week on set of right UE and LE weakness. She feels like she keeps tripping over her toe. She iis losing fucntional use of her right hand. 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 PMH: smoker and ETOH use     PT Comments    Patient with improved rt sided strength. Continues to have steppage gait and rt quad weakness with concern for knee buckling. Attempted use of RW as hand grip has improved, however her inattention to RUE caused hand to slip from RW grip without her noticing. Despite cues each time this occurred, she did not improve her awareness. Noted plans for OR likely 7/3. Will follow after surgery when appropriate to resume.     Follow Up Recommendations  Other (comment)(reassess after surgery)     Equipment Recommendations  (will trial with a cane; may also need AFO )    Recommendations for Other Services Rehab consult     Precautions / Restrictions Precautions Precautions: Fall Precaution Comments: reports she has nearly tripped over her toe on several occasions  Restrictions Weight Bearing Restrictions: No    Mobility  Bed Mobility                  Transfers Overall transfer level: Needs assistance Equipment used: None;Rolling walker (2 wheeled) Transfers: Sit to/from Stand Sit to Stand: Min guard         General transfer comment: guarding for safety; no dizziness or imbalance; vc for use of RW/sequencing  Ambulation/Gait Ambulation/Gait assistance: Min guard;Min assist Gait Distance (Feet): 30 Feet(x 3) Assistive device: None;Rolling walker (2 wheeled) Gait Pattern/deviations: Step-to pattern;Decreased step length - right;Decreased stance time - right;Step-through pattern;Steppage Gait velocity:  decreased   General Gait Details: pt reports episode of knee buckling PTA; quads remain 3+/5 and attempted use of RW as grip has improved; she did not attend to rt hand and it frequently slipped off grip; even when having trouble steering RW (because she was only using LUE) she did not attend to rt hand to make sure she was holding RW (this occurred multiple times with no emergent awareness noted)   Stairs             Wheelchair Mobility    Modified Rankin (Stroke Patients Only) Modified Rankin (Stroke Patients Only) Pre-Morbid Rankin Score: No symptoms Modified Rankin: Slight disability     Balance Overall balance assessment: Needs assistance         Standing balance support: No upper extremity supported Standing balance-Leahy Scale: Fair                 High Level Balance Comments: wide sharpened Romberg with imbalance attempting position and once recovered again needed assist to maintain            Cognition Arousal/Alertness: Awake/alert Behavior During Therapy: WFL for tasks assessed/performed Overall Cognitive Status: Impaired/Different from baseline                                 General Comments: despite multiple cues throughout session to use RUE, she "forgets" it is there and uses LUE      Exercises General Exercises - Lower Extremity Ankle Circles/Pumps: AROM;Strengthening;Right;10 reps;Seated Long Arc Quad:  AROM;Strengthening;Right;10 reps(5 second hold) Hip Flexion/Marching: AROM;Strengthening;Right;5 reps;Standing(bil UE support) Toe Raises: Strengthening;Both;5 reps;Standing(light bil UE support) Heel Raises: Strengthening;Both;5 reps;Standing(bil UE support)    General Comments        Pertinent Vitals/Pain Pain Assessment: No/denies pain    Home Living Family/patient expects to be discharged to:: Private residence Living Arrangements: Parent Available Help at Discharge: Family Type of Home: House Home Access:  Stairs to enter Entrance Stairs-Rails: Can reach both Home Layout: Two level   Additional Comments: Patient lives with father who has dementia and he drives. pt has a daughter that can give PRN (A) only due to working    Prior Function Level of Independence: Independent      Comments: was not using AD. Was the primary caregiver for father and mother  who passed reccently    PT Goals (current goals can now be found in the care plan section) Acute Rehab PT Goals Patient Stated Goal: to return home to father Time For Goal Achievement: 02/18/19 Progress towards PT goals: Progressing toward goals    Frequency    Min 3X/week      PT Plan Other (comment)(will reassess after surgery)    Co-evaluation              AM-PAC PT "6 Clicks" Mobility   Outcome Measure  Help needed turning from your back to your side while in a flat bed without using bedrails?: A Little Help needed moving from lying on your back to sitting on the side of a flat bed without using bedrails?: A Little Help needed moving to and from a bed to a chair (including a wheelchair)?: A Little Help needed standing up from a chair using your arms (e.g., wheelchair or bedside chair)?: A Little Help needed to walk in hospital room?: A Little Help needed climbing 3-5 steps with a railing? : A Lot 6 Click Score: 17    End of Session Equipment Utilized During Treatment: Gait belt Activity Tolerance: Patient tolerated treatment well Patient left: in chair;with call bell/phone within reach;with chair alarm set   PT Visit Diagnosis: Other abnormalities of gait and mobility (R26.89);Unsteadiness on feet (R26.81);Muscle weakness (generalized) (M62.81)     Time: 7867-5449 PT Time Calculation (min) (ACUTE ONLY): 29 min  Charges:  $Gait Training: 8-22 mins $Neuromuscular Re-education: 8-22 mins                        [employer], PT 02/13/2019, 11:40 AM

## 2019-02-13 DEATH — deceased

## 2019-02-14 ENCOUNTER — Inpatient Hospital Stay (HOSPITAL_COMMUNITY): Payer: BC Managed Care – PPO

## 2019-02-14 ENCOUNTER — Inpatient Hospital Stay (HOSPITAL_COMMUNITY): Payer: BC Managed Care – PPO | Admitting: Anesthesiology

## 2019-02-14 ENCOUNTER — Inpatient Hospital Stay (HOSPITAL_COMMUNITY): Payer: BC Managed Care – PPO | Admitting: Certified Registered Nurse Anesthetist

## 2019-02-14 ENCOUNTER — Encounter (HOSPITAL_COMMUNITY): Payer: Self-pay | Admitting: *Deleted

## 2019-02-14 ENCOUNTER — Encounter (HOSPITAL_COMMUNITY): Admission: EM | Disposition: E | Payer: Self-pay | Source: Home / Self Care | Attending: Neurological Surgery

## 2019-02-14 DIAGNOSIS — G9389 Other specified disorders of brain: Secondary | ICD-10-CM

## 2019-02-14 DIAGNOSIS — R531 Weakness: Secondary | ICD-10-CM | POA: Diagnosis not present

## 2019-02-14 DIAGNOSIS — Z8679 Personal history of other diseases of the circulatory system: Secondary | ICD-10-CM | POA: Diagnosis not present

## 2019-02-14 DIAGNOSIS — D496 Neoplasm of unspecified behavior of brain: Secondary | ICD-10-CM | POA: Diagnosis present

## 2019-02-14 DIAGNOSIS — Z9889 Other specified postprocedural states: Secondary | ICD-10-CM

## 2019-02-14 DIAGNOSIS — T380X5A Adverse effect of glucocorticoids and synthetic analogues, initial encounter: Secondary | ICD-10-CM

## 2019-02-14 DIAGNOSIS — R739 Hyperglycemia, unspecified: Secondary | ICD-10-CM | POA: Diagnosis not present

## 2019-02-14 HISTORY — PX: CRANIOTOMY: SHX93

## 2019-02-14 HISTORY — PX: APPLICATION OF CRANIAL NAVIGATION: SHX6578

## 2019-02-14 LAB — POCT I-STAT 7, (LYTES, BLD GAS, ICA,H+H)
Acid-base deficit: 1 mmol/L (ref 0.0–2.0)
Bicarbonate: 24 mmol/L (ref 20.0–28.0)
Bicarbonate: 25.1 mmol/L (ref 20.0–28.0)
Calcium, Ion: 1.16 mmol/L (ref 1.15–1.40)
Calcium, Ion: 1.19 mmol/L (ref 1.15–1.40)
HCT: 26 % — ABNORMAL LOW (ref 36.0–46.0)
HCT: 35 % — ABNORMAL LOW (ref 36.0–46.0)
Hemoglobin: 11.9 g/dL — ABNORMAL LOW (ref 12.0–15.0)
Hemoglobin: 8.8 g/dL — ABNORMAL LOW (ref 12.0–15.0)
O2 Saturation: 100 %
O2 Saturation: 100 %
Patient temperature: 35.4
Patient temperature: 98
Potassium: 3.7 mmol/L (ref 3.5–5.1)
Potassium: 4.3 mmol/L (ref 3.5–5.1)
Sodium: 143 mmol/L (ref 135–145)
Sodium: 146 mmol/L — ABNORMAL HIGH (ref 135–145)
TCO2: 25 mmol/L (ref 22–32)
TCO2: 27 mmol/L (ref 22–32)
pCO2 arterial: 34.8 mmHg (ref 32.0–48.0)
pCO2 arterial: 46.1 mmHg (ref 32.0–48.0)
pH, Arterial: 7.336 — ABNORMAL LOW (ref 7.350–7.450)
pH, Arterial: 7.445 (ref 7.350–7.450)
pO2, Arterial: 202 mmHg — ABNORMAL HIGH (ref 83.0–108.0)
pO2, Arterial: 273 mmHg — ABNORMAL HIGH (ref 83.0–108.0)

## 2019-02-14 LAB — CBC
HCT: 39.3 % (ref 36.0–46.0)
Hemoglobin: 12.9 g/dL (ref 12.0–15.0)
MCH: 32.8 pg (ref 26.0–34.0)
MCHC: 32.8 g/dL (ref 30.0–36.0)
MCV: 100 fL (ref 80.0–100.0)
Platelets: 318 10*3/uL (ref 150–400)
RBC: 3.93 MIL/uL (ref 3.87–5.11)
RDW: 14.6 % (ref 11.5–15.5)
WBC: 14.7 10*3/uL — ABNORMAL HIGH (ref 4.0–10.5)
nRBC: 0 % (ref 0.0–0.2)

## 2019-02-14 LAB — GLUCOSE, CAPILLARY
Glucose-Capillary: 141 mg/dL — ABNORMAL HIGH (ref 70–99)
Glucose-Capillary: 126 mg/dL — ABNORMAL HIGH (ref 70–99)
Glucose-Capillary: 177 mg/dL — ABNORMAL HIGH (ref 70–99)

## 2019-02-14 LAB — SURGICAL PCR SCREEN
MRSA, PCR: NEGATIVE
Staphylococcus aureus: NEGATIVE

## 2019-02-14 LAB — TYPE AND SCREEN
ABO/RH(D): O POS
Antibody Screen: NEGATIVE

## 2019-02-14 LAB — BASIC METABOLIC PANEL
Anion gap: 8 (ref 5–15)
BUN: 15 mg/dL (ref 8–23)
CO2: 26 mmol/L (ref 22–32)
Calcium: 8.7 mg/dL — ABNORMAL LOW (ref 8.9–10.3)
Chloride: 108 mmol/L (ref 98–111)
Creatinine, Ser: 0.71 mg/dL (ref 0.44–1.00)
GFR calc Af Amer: >60 mL/min (ref >60)
GFR calc non Af Amer: >60 mL/min (ref >60)
Glucose, Bld: 134 mg/dL — ABNORMAL HIGH (ref 70–99)
Potassium: 3.8 mmol/L (ref 3.5–5.1)
Sodium: 142 mmol/L (ref 135–145)

## 2019-02-14 LAB — TRIGLYCERIDES: Triglycerides: 48 mg/dL (ref <150)

## 2019-02-14 LAB — ABO/RH: ABO/RH(D): O POS

## 2019-02-14 SURGERY — CRANIOTOMY TUMOR EXCISION
Anesthesia: General | Site: Head | Laterality: Left

## 2019-02-14 SURGERY — CRANIOTOMY HEMATOMA EVACUATION SUBDURAL
Anesthesia: General | Laterality: Left

## 2019-02-14 MED ORDER — PROPOFOL 1000 MG/100ML IV EMUL
INTRAVENOUS | Status: AC
  Filled 2019-02-14: qty 200

## 2019-02-14 MED ORDER — THROMBIN 5000 UNITS EX SOLR
CUTANEOUS | Status: AC
  Filled 2019-02-14: qty 5000

## 2019-02-14 MED ORDER — LACTATED RINGERS IV SOLN
INTRAVENOUS | Status: DC | PRN
  Administered 2019-02-14: 17:00:00 via INTRAVENOUS

## 2019-02-14 MED ORDER — SODIUM CHLORIDE 3 % IV SOLN
INTRAVENOUS | Status: AC
  Administered 2019-02-14: 50 mL/h via INTRAVENOUS
  Administered 2019-02-15: 75 mL/h via INTRAVENOUS
  Filled 2019-02-14 (×4): qty 500

## 2019-02-14 MED ORDER — LIDOCAINE 2% (20 MG/ML) 5 ML SYRINGE
INTRAMUSCULAR | Status: DC | PRN
  Administered 2019-02-14: 60 mg via INTRAVENOUS
  Administered 2019-02-14: 40 mg via INTRAVENOUS
  Administered 2019-02-14: 30 mg via INTRAVENOUS

## 2019-02-14 MED ORDER — LIDOCAINE 2% (20 MG/ML) 5 ML SYRINGE
INTRAMUSCULAR | Status: AC
  Filled 2019-02-14: qty 5

## 2019-02-14 MED ORDER — FAMOTIDINE IN NACL 20-0.9 MG/50ML-% IV SOLN
20.0000 mg | Freq: Two times a day (BID) | INTRAVENOUS | Status: DC
  Administered 2019-02-14: 20 mg via INTRAVENOUS
  Filled 2019-02-14 (×2): qty 50

## 2019-02-14 MED ORDER — ONDANSETRON HCL 4 MG/2ML IJ SOLN
INTRAMUSCULAR | Status: DC | PRN
  Administered 2019-02-14: 4 mg via INTRAVENOUS

## 2019-02-14 MED ORDER — LIDOCAINE-EPINEPHRINE 1 %-1:100000 IJ SOLN
INTRAMUSCULAR | Status: AC
  Filled 2019-02-14: qty 1

## 2019-02-14 MED ORDER — HYDRALAZINE HCL 20 MG/ML IJ SOLN
10.0000 mg | INTRAMUSCULAR | Status: DC | PRN
  Administered 2019-02-14 – 2019-02-21 (×6): 10 mg via INTRAVENOUS
  Filled 2019-02-14 (×7): qty 1

## 2019-02-14 MED ORDER — OXYCODONE HCL 5 MG PO TABS: 5.0000 mg | ORAL_TABLET | Freq: Once | ORAL | Status: DC | PRN

## 2019-02-14 MED ORDER — ONDANSETRON HCL 4 MG PO TABS: 4.0000 mg | ORAL_TABLET | ORAL | Status: DC | PRN

## 2019-02-14 MED ORDER — DEXAMETHASONE SODIUM PHOSPHATE 4 MG/ML IJ SOLN: 4.0000 mg | Freq: Three times a day (TID) | INTRAMUSCULAR | Status: DC

## 2019-02-14 MED ORDER — MIDAZOLAM HCL 2 MG/2ML IJ SOLN
INTRAMUSCULAR | Status: AC
  Filled 2019-02-14: qty 2

## 2019-02-14 MED ORDER — THROMBIN 5000 UNITS EX SOLR
OROMUCOSAL | Status: DC | PRN
  Administered 2019-02-14 (×3): 5 mL via TOPICAL

## 2019-02-14 MED ORDER — CEFAZOLIN SODIUM 1 G IJ SOLR
INTRAMUSCULAR | Status: AC
  Filled 2019-02-14: qty 10

## 2019-02-14 MED ORDER — SODIUM CHLORIDE 0.9 % IV SOLN
INTRAVENOUS | Status: DC | PRN
  Administered 2019-02-14: 17:00:00 25 ug/min via INTRAVENOUS

## 2019-02-14 MED ORDER — BACITRACIN ZINC 500 UNIT/GM EX OINT
TOPICAL_OINTMENT | CUTANEOUS | Status: AC
  Filled 2019-02-14: qty 28.35

## 2019-02-14 MED ORDER — SUCCINYLCHOLINE CHLORIDE 20 MG/ML IJ SOLN
INTRAMUSCULAR | Status: DC | PRN
  Administered 2019-02-14: 120 mg via INTRAVENOUS

## 2019-02-14 MED ORDER — THROMBIN 5000 UNITS EX SOLR
OROMUCOSAL | Status: DC | PRN
  Administered 2019-02-14: 18:00:00 5 mL via TOPICAL

## 2019-02-14 MED ORDER — SODIUM CHLORIDE 0.9 % IV SOLN
INTRAVENOUS | Status: DC | PRN
  Administered 2019-02-14: 10:00:00 20 ug/min via INTRAVENOUS

## 2019-02-14 MED ORDER — OXYCODONE HCL 5 MG/5ML PO SOLN: 5.0000 mg | Freq: Once | ORAL | Status: DC | PRN

## 2019-02-14 MED ORDER — HEMOSTATIC AGENTS (NO CHARGE) OPTIME
TOPICAL | Status: DC | PRN
  Administered 2019-02-14: 1 via TOPICAL

## 2019-02-14 MED ORDER — ONDANSETRON HCL 4 MG/2ML IJ SOLN
INTRAMUSCULAR | Status: AC
  Filled 2019-02-14: qty 2

## 2019-02-14 MED ORDER — ROCURONIUM BROMIDE 10 MG/ML (PF) SYRINGE
PREFILLED_SYRINGE | INTRAVENOUS | Status: AC
  Filled 2019-02-14: qty 10

## 2019-02-14 MED ORDER — FENTANYL CITRATE (PF) 250 MCG/5ML IJ SOLN
INTRAMUSCULAR | Status: DC | PRN
  Administered 2019-02-14 (×2): 25 ug via INTRAVENOUS
  Administered 2019-02-14: 100 ug via INTRAVENOUS
  Administered 2019-02-14: 25 ug via INTRAVENOUS

## 2019-02-14 MED ORDER — HEMOSTATIC AGENTS (NO CHARGE) OPTIME
TOPICAL | Status: DC | PRN
  Administered 2019-02-14: 1

## 2019-02-14 MED ORDER — 0.9 % SODIUM CHLORIDE (POUR BTL) OPTIME
TOPICAL | Status: DC | PRN
  Administered 2019-02-14 (×3): 1000 mL

## 2019-02-14 MED ORDER — SODIUM CHLORIDE 0.9 % IV SOLN
INTRAVENOUS | Status: DC | PRN
  Administered 2019-02-14: 09:00:00 via INTRAVENOUS

## 2019-02-14 MED ORDER — HYDROMORPHONE HCL 1 MG/ML IJ SOLN
0.5000 mg | INTRAMUSCULAR | Status: DC | PRN
  Administered 2019-02-15 – 2019-02-18 (×12): 0.5 mg via INTRAVENOUS
  Filled 2019-02-14 (×12): qty 1

## 2019-02-14 MED ORDER — THROMBIN 20000 UNITS EX SOLR
CUTANEOUS | Status: AC
  Filled 2019-02-14: qty 20000

## 2019-02-14 MED ORDER — ORAL CARE MOUTH RINSE: 15.0000 mL | OROMUCOSAL | Status: DC

## 2019-02-14 MED ORDER — FENTANYL CITRATE (PF) 250 MCG/5ML IJ SOLN
INTRAMUSCULAR | Status: AC
  Filled 2019-02-14: qty 5

## 2019-02-14 MED ORDER — PROPOFOL 10 MG/ML IV BOLUS
INTRAVENOUS | Status: AC
  Filled 2019-02-14: qty 20

## 2019-02-14 MED ORDER — SUGAMMADEX SODIUM 200 MG/2ML IV SOLN
INTRAVENOUS | Status: DC | PRN
  Administered 2019-02-14: 180 mg via INTRAVENOUS

## 2019-02-14 MED ORDER — LIDOCAINE 2% (20 MG/ML) 5 ML SYRINGE
INTRAMUSCULAR | Status: DC | PRN
  Administered 2019-02-14: 60 mg via INTRAVENOUS

## 2019-02-14 MED ORDER — LABETALOL HCL 5 MG/ML IV SOLN
10.0000 mg | INTRAVENOUS | Status: DC | PRN
  Administered 2019-02-15: 10 mg via INTRAVENOUS
  Administered 2019-02-23: 09:00:00 20 mg via INTRAVENOUS
  Filled 2019-02-14 (×2): qty 4

## 2019-02-14 MED ORDER — NITROGLYCERIN IN D5W 200-5 MCG/ML-% IV SOLN
INTRAVENOUS | Status: AC
  Filled 2019-02-14: qty 250

## 2019-02-14 MED ORDER — ONDANSETRON HCL 4 MG/2ML IJ SOLN: 4.0000 mg | INTRAMUSCULAR | Status: DC | PRN

## 2019-02-14 MED ORDER — PROPOFOL 500 MG/50ML IV EMUL
INTRAVENOUS | Status: DC | PRN
  Administered 2019-02-14: 50 ug/kg/min via INTRAVENOUS

## 2019-02-14 MED ORDER — MANNITOL 25 % IV SOLN
INTRAVENOUS | Status: AC
  Filled 2019-02-14: qty 50

## 2019-02-14 MED ORDER — CEFAZOLIN SODIUM-DEXTROSE 2-4 GM/100ML-% IV SOLN
2.0000 g | Freq: Three times a day (TID) | INTRAVENOUS | Status: AC
  Administered 2019-02-14 – 2019-02-15 (×2): 2 g via INTRAVENOUS
  Filled 2019-02-14 (×2): qty 100

## 2019-02-14 MED ORDER — LABETALOL HCL 5 MG/ML IV SOLN
INTRAVENOUS | Status: AC
  Filled 2019-02-14: qty 4

## 2019-02-14 MED ORDER — FENTANYL CITRATE (PF) 250 MCG/5ML IJ SOLN
INTRAMUSCULAR | Status: DC | PRN
  Administered 2019-02-14 (×2): 100 ug via INTRAVENOUS
  Administered 2019-02-14: 50 ug via INTRAVENOUS

## 2019-02-14 MED ORDER — DEXAMETHASONE SODIUM PHOSPHATE 4 MG/ML IJ SOLN: 4.0000 mg | Freq: Four times a day (QID) | INTRAMUSCULAR | Status: DC

## 2019-02-14 MED ORDER — THROMBIN 20000 UNITS EX SOLR
CUTANEOUS | Status: DC | PRN
  Administered 2019-02-14: 20 mL via TOPICAL

## 2019-02-14 MED ORDER — ROCURONIUM BROMIDE 10 MG/ML (PF) SYRINGE
PREFILLED_SYRINGE | INTRAVENOUS | Status: DC | PRN
  Administered 2019-02-14: 70 mg via INTRAVENOUS

## 2019-02-14 MED ORDER — SODIUM CHLORIDE 0.9 % IV SOLN
INTRAVENOUS | Status: DC | PRN
  Administered 2019-02-14: 17:00:00 via INTRAVENOUS

## 2019-02-14 MED ORDER — CEFAZOLIN SODIUM-DEXTROSE 2-3 GM-%(50ML) IV SOLR
INTRAVENOUS | Status: DC | PRN
  Administered 2019-02-14: 2 g via INTRAVENOUS

## 2019-02-14 MED ORDER — LIDOCAINE-EPINEPHRINE 1 %-1:100000 IJ SOLN
INTRAMUSCULAR | Status: DC | PRN
  Administered 2019-02-14: 7 mL

## 2019-02-14 MED ORDER — SODIUM CHLORIDE 0.9 % IV SOLN
0.0500 ug/kg/min | INTRAVENOUS | Status: DC
  Administered 2019-02-14: 0.1 ug/kg/min via INTRAVENOUS
  Filled 2019-02-14: qty 5000
  Filled 2019-02-14: qty 4000
  Filled 2019-02-14: qty 5000

## 2019-02-14 MED ORDER — MANNITOL 20 % IV SOLN
50.0000 g | Freq: Once | Status: AC
  Administered 2019-02-14: 50 g via INTRAVENOUS
  Filled 2019-02-14: qty 250

## 2019-02-14 MED ORDER — INSULIN ASPART 100 UNIT/ML ~~LOC~~ SOLN
0.0000 [IU] | SUBCUTANEOUS | Status: DC
  Administered 2019-02-14: 2 [IU] via SUBCUTANEOUS
  Administered 2019-02-15 (×3): 1 [IU] via SUBCUTANEOUS
  Administered 2019-02-16: 2 [IU] via SUBCUTANEOUS
  Administered 2019-02-16 – 2019-02-17 (×4): 1 [IU] via SUBCUTANEOUS
  Administered 2019-02-17: 2 [IU] via SUBCUTANEOUS
  Administered 2019-02-17: 19:00:00 1 [IU] via SUBCUTANEOUS
  Administered 2019-02-17: 3 [IU] via SUBCUTANEOUS
  Administered 2019-02-18: 1 [IU] via SUBCUTANEOUS
  Administered 2019-02-18: 2 [IU] via SUBCUTANEOUS
  Administered 2019-02-18: 3 [IU] via SUBCUTANEOUS
  Administered 2019-02-18 – 2019-02-19 (×8): 1 [IU] via SUBCUTANEOUS
  Administered 2019-02-20: 2 [IU] via SUBCUTANEOUS
  Administered 2019-02-20: 1 [IU] via SUBCUTANEOUS
  Administered 2019-02-21: 2 [IU] via SUBCUTANEOUS
  Administered 2019-02-21 – 2019-02-22 (×4): 1 [IU] via SUBCUTANEOUS

## 2019-02-14 MED ORDER — DEXAMETHASONE SODIUM PHOSPHATE 10 MG/ML IJ SOLN
INTRAMUSCULAR | Status: AC
  Filled 2019-02-14: qty 1

## 2019-02-14 MED ORDER — BACITRACIN ZINC 500 UNIT/GM EX OINT
TOPICAL_OINTMENT | CUTANEOUS | Status: DC | PRN
  Administered 2019-02-14: 1 via TOPICAL

## 2019-02-14 MED ORDER — ALBUMIN HUMAN 5 % IV SOLN
INTRAVENOUS | Status: DC | PRN
  Administered 2019-02-14 (×3): via INTRAVENOUS

## 2019-02-14 MED ORDER — HYDROMORPHONE HCL 1 MG/ML IJ SOLN: 0.2500 mg | INTRAMUSCULAR | Status: DC | PRN

## 2019-02-14 MED ORDER — PHENYLEPHRINE 40 MCG/ML (10ML) SYRINGE FOR IV PUSH (FOR BLOOD PRESSURE SUPPORT)
PREFILLED_SYRINGE | INTRAVENOUS | Status: AC
  Filled 2019-02-14: qty 10

## 2019-02-14 MED ORDER — THROMBIN 20000 UNITS EX SOLR
CUTANEOUS | Status: DC | PRN
  Administered 2019-02-14: 18:00:00 20 mL via TOPICAL

## 2019-02-14 MED ORDER — PANTOPRAZOLE SODIUM 40 MG IV SOLR
40.0000 mg | Freq: Every day | INTRAVENOUS | Status: DC
  Administered 2019-02-15 – 2019-02-17 (×3): 40 mg via INTRAVENOUS
  Filled 2019-02-14 (×4): qty 40

## 2019-02-14 MED ORDER — PHENYLEPHRINE HCL-NACL 10-0.9 MG/250ML-% IV SOLN
INTRAVENOUS | Status: AC
  Filled 2019-02-14: qty 250

## 2019-02-14 MED ORDER — PROMETHAZINE HCL 25 MG PO TABS: 12.5000 mg | ORAL_TABLET | ORAL | Status: DC | PRN

## 2019-02-14 MED ORDER — ROCURONIUM BROMIDE 10 MG/ML (PF) SYRINGE
PREFILLED_SYRINGE | INTRAVENOUS | Status: DC | PRN
  Administered 2019-02-14: 70 mg via INTRAVENOUS
  Administered 2019-02-14 (×2): 30 mg via INTRAVENOUS
  Administered 2019-02-14 (×2): 20 mg via INTRAVENOUS

## 2019-02-14 MED ORDER — OXYCODONE HCL 5 MG PO TABS: 5.0000 mg | ORAL_TABLET | ORAL | Status: DC | PRN

## 2019-02-14 MED ORDER — OXYCODONE HCL 5 MG PO TABS: 10.0000 mg | ORAL_TABLET | ORAL | Status: DC | PRN

## 2019-02-14 MED ORDER — ARTIFICIAL TEARS OPHTHALMIC OINT
TOPICAL_OINTMENT | OPHTHALMIC | Status: DC | PRN
  Administered 2019-02-14: 1 via OPHTHALMIC

## 2019-02-14 MED ORDER — ACETAMINOPHEN 325 MG PO TABS: 650.0000 mg | ORAL_TABLET | ORAL | Status: DC | PRN

## 2019-02-14 MED ORDER — DEXAMETHASONE SODIUM PHOSPHATE 10 MG/ML IJ SOLN: 6.0000 mg | Freq: Four times a day (QID) | INTRAMUSCULAR | Status: DC

## 2019-02-14 MED ORDER — CHLORHEXIDINE GLUCONATE 0.12% ORAL RINSE (MEDLINE KIT)
15.0000 mL | Freq: Two times a day (BID) | OROMUCOSAL | Status: DC
  Administered 2019-02-15 – 2019-02-25 (×20): 15 mL via OROMUCOSAL

## 2019-02-14 MED ORDER — ESMOLOL HCL 100 MG/10ML IV SOLN
INTRAVENOUS | Status: DC | PRN
  Administered 2019-02-14: 50 mg via INTRAVENOUS
  Administered 2019-02-14: 70 mg via INTRAVENOUS
  Administered 2019-02-14 (×2): 50 mg via INTRAVENOUS
  Administered 2019-02-14: 30 mg via INTRAVENOUS
  Administered 2019-02-14: 50 mg via INTRAVENOUS

## 2019-02-14 MED ORDER — CHLORHEXIDINE GLUCONATE 0.12% ORAL RINSE (MEDLINE KIT)
15.0000 mL | Freq: Two times a day (BID) | OROMUCOSAL | Status: DC
  Administered 2019-02-14: 15 mL via OROMUCOSAL

## 2019-02-14 MED ORDER — PROPOFOL 10 MG/ML IV BOLUS
INTRAVENOUS | Status: DC | PRN
  Administered 2019-02-14: 120 mg via INTRAVENOUS

## 2019-02-14 MED ORDER — PROMETHAZINE HCL 25 MG/ML IJ SOLN: 6.2500 mg | INTRAMUSCULAR | Status: DC | PRN

## 2019-02-14 MED ORDER — SODIUM CHLORIDE 0.9 % IV SOLN
INTRAVENOUS | Status: DC
  Administered 2019-02-14 – 2019-02-21 (×6): via INTRAVENOUS

## 2019-02-14 MED ORDER — PROPOFOL 1000 MG/100ML IV EMUL: 5.0000 ug/kg/min | INTRAVENOUS | Status: DC

## 2019-02-14 MED ORDER — CHLORHEXIDINE GLUCONATE CLOTH 2 % EX PADS
6.0000 | MEDICATED_PAD | Freq: Every day | CUTANEOUS | Status: DC
  Administered 2019-02-14 – 2019-02-24 (×5): 6 via TOPICAL

## 2019-02-14 MED ORDER — FENTANYL 2500MCG IN NS 250ML (10MCG/ML) PREMIX INFUSION
0.0000 ug/h | INTRAVENOUS | Status: DC
  Filled 2019-02-14: qty 250

## 2019-02-14 MED ORDER — MANNITOL 25 % IV SOLN
INTRAVENOUS | Status: DC | PRN
  Administered 2019-02-14: 25 g via INTRAVENOUS

## 2019-02-14 MED ORDER — PROPOFOL 10 MG/ML IV BOLUS
INTRAVENOUS | Status: DC | PRN
  Administered 2019-02-14: 150 mg via INTRAVENOUS

## 2019-02-14 MED ORDER — EPHEDRINE 5 MG/ML INJ
INTRAVENOUS | Status: AC
  Filled 2019-02-14: qty 10

## 2019-02-14 MED ORDER — SUCCINYLCHOLINE CHLORIDE 200 MG/10ML IV SOSY
PREFILLED_SYRINGE | INTRAVENOUS | Status: AC
  Filled 2019-02-14: qty 10

## 2019-02-14 MED ORDER — SODIUM CHLORIDE 0.9 % IV SOLN
INTRAVENOUS | Status: DC | PRN
  Administered 2019-02-14: 500 mL

## 2019-02-14 MED ORDER — ORAL CARE MOUTH RINSE
15.0000 mL | OROMUCOSAL | Status: DC
  Administered 2019-02-14 – 2019-02-24 (×93): 15 mL via OROMUCOSAL

## 2019-02-14 MED ORDER — ACETAMINOPHEN 650 MG RE SUPP: 650.0000 mg | RECTAL | Status: DC | PRN

## 2019-02-14 SURGICAL SUPPLY — 44 items
CANISTER SUCT 3000ML PPV (MISCELLANEOUS) ×5 IMPLANT
DRAPE NEUROLOGICAL W/INCISE (DRAPES) ×3 IMPLANT
DRAPE ORTHO SPLIT 77X108 STRL (DRAPES) ×2
DRAPE SHEET LG 3/4 BI-LAMINATE (DRAPES) ×3 IMPLANT
DRAPE SURG ORHT 6 SPLT 77X108 (DRAPES) IMPLANT
DRAPE WARM FLUID 44X44 (DRAPES) ×3 IMPLANT
DURAPREP 6ML APPLICATOR 50/CS (WOUND CARE) ×3 IMPLANT
ELECT REM PT RETURN 9FT ADLT (ELECTROSURGICAL) ×3
ELECTRODE REM PT RTRN 9FT ADLT (ELECTROSURGICAL) ×1 IMPLANT
GLOVE BIO SURGEON STRL SZ7.5 (GLOVE) ×6 IMPLANT
GLOVE BIOGEL PI IND STRL 7.5 (GLOVE) ×2 IMPLANT
GLOVE BIOGEL PI IND STRL 8 (GLOVE) IMPLANT
GLOVE BIOGEL PI IND STRL 8.5 (GLOVE) IMPLANT
GLOVE BIOGEL PI INDICATOR 7.5 (GLOVE) ×4
GLOVE BIOGEL PI INDICATOR 8 (GLOVE) ×4
GLOVE BIOGEL PI INDICATOR 8.5 (GLOVE) ×4
GLOVE ECLIPSE 7.0 STRL STRAW (GLOVE) ×4 IMPLANT
GLOVE ECLIPSE 7.5 STRL STRAW (GLOVE) ×4 IMPLANT
GOWN STRL REUS W/ TWL LRG LVL3 (GOWN DISPOSABLE) ×2 IMPLANT
GOWN STRL REUS W/ TWL XL LVL3 (GOWN DISPOSABLE) IMPLANT
GOWN STRL REUS W/TWL 2XL LVL3 (GOWN DISPOSABLE) ×2 IMPLANT
GOWN STRL REUS W/TWL LRG LVL3 (GOWN DISPOSABLE) ×4
GOWN STRL REUS W/TWL XL LVL3 (GOWN DISPOSABLE) ×2
HEMOSTAT POWDER KIT SURGIFOAM (HEMOSTASIS) ×3 IMPLANT
HEMOSTAT SURGICEL 2X14 (HEMOSTASIS) IMPLANT
HOOK DURA 1/2IN (MISCELLANEOUS) ×3 IMPLANT
KIT BASIN OR (CUSTOM PROCEDURE TRAY) ×3 IMPLANT
KIT TURNOVER KIT B (KITS) ×3 IMPLANT
NEEDLE HYPO 22GX1.5 SAFETY (NEEDLE) ×3 IMPLANT
NS IRRIG 1000ML POUR BTL (IV SOLUTION) ×3 IMPLANT
PACK CRANIOTOMY CUSTOM (CUSTOM PROCEDURE TRAY) ×3 IMPLANT
PATTIES SURGICAL 1X1 (DISPOSABLE) IMPLANT
SCREW SELF DRILL HT 1.5/4MM (Screw) ×12 IMPLANT
SPONGE SURGIFOAM ABS GEL 100 (HEMOSTASIS) ×3 IMPLANT
STAPLER VISISTAT 35W (STAPLE) ×3 IMPLANT
SUT NURALON 4 0 TR CR/8 (SUTURE) ×7 IMPLANT
SUT VIC AB 0 CT1 18XCR BRD8 (SUTURE) ×2 IMPLANT
SUT VIC AB 0 CT1 8-18 (SUTURE) ×2
SUT VIC AB 3-0 SH 8-18 (SUTURE) ×6 IMPLANT
TOWEL GREEN STERILE (TOWEL DISPOSABLE) ×3 IMPLANT
TOWEL GREEN STERILE FF (TOWEL DISPOSABLE) ×3 IMPLANT
TUBE CONNECTING 12'X1/4 (SUCTIONS) ×1
TUBE CONNECTING 12X1/4 (SUCTIONS) ×2 IMPLANT
WATER STERILE IRR 1000ML POUR (IV SOLUTION) ×3 IMPLANT

## 2019-02-14 SURGICAL SUPPLY — 101 items
BENZOIN TINCTURE PRP APPL 2/3 (GAUZE/BANDAGES/DRESSINGS) IMPLANT
BLADE CLIPPER SURG (BLADE) ×3 IMPLANT
BLADE SAW GIGLI 16 STRL (MISCELLANEOUS) IMPLANT
BLADE SURG 15 STRL LF DISP TIS (BLADE) IMPLANT
BLADE SURG 15 STRL SS (BLADE)
BNDG GAUZE ELAST 4 BULKY (GAUZE/BANDAGES/DRESSINGS) IMPLANT
BNDG STRETCH 4X75 STRL LF (GAUZE/BANDAGES/DRESSINGS) IMPLANT
BUR ACORN 9.0 PRECISION (BURR) ×2 IMPLANT
BUR ACORN 9.0MM PRECISION (BURR) ×1
BUR ROUND FLUTED 4 SOFT TCH (BURR) ×1 IMPLANT
BUR ROUND FLUTED 4MM SOFT TCH (BURR) ×1
BUR SPIRAL ROUTER 2.3 (BUR) ×2 IMPLANT
BUR SPIRAL ROUTER 2.3MM (BUR) ×1
CANISTER SUCT 3000ML PPV (MISCELLANEOUS) ×6 IMPLANT
CATH VENTRIC 35X38 W/TROCAR LG (CATHETERS) IMPLANT
CLIP VESOCCLUDE MED 6/CT (CLIP) IMPLANT
CONT SPEC 4OZ CLIKSEAL STRL BL (MISCELLANEOUS) ×3 IMPLANT
COVER MAYO STAND STRL (DRAPES) IMPLANT
COVER WAND RF STERILE (DRAPES) ×3 IMPLANT
DECANTER SPIKE VIAL GLASS SM (MISCELLANEOUS) ×3 IMPLANT
DRAIN SUBARACHNOID (WOUND CARE) IMPLANT
DRAPE HALF SHEET 40X57 (DRAPES) ×3 IMPLANT
DRAPE MICROSCOPE LEICA (MISCELLANEOUS) ×2 IMPLANT
DRAPE NEUROLOGICAL W/INCISE (DRAPES) ×3 IMPLANT
DRAPE STERI IOBAN 125X83 (DRAPES) IMPLANT
DRAPE SURG 17X23 STRL (DRAPES) IMPLANT
DRAPE WARM FLUID 44X44 (DRAPES) ×3 IMPLANT
DRSG ADAPTIC 3X8 NADH LF (GAUZE/BANDAGES/DRESSINGS) IMPLANT
DRSG TELFA 3X8 NADH (GAUZE/BANDAGES/DRESSINGS) IMPLANT
DURAPREP 6ML APPLICATOR 50/CS (WOUND CARE) ×3 IMPLANT
ELECT REM PT RETURN 9FT ADLT (ELECTROSURGICAL) ×3
ELECTRODE REM PT RTRN 9FT ADLT (ELECTROSURGICAL) ×1 IMPLANT
EVACUATOR 1/8 PVC DRAIN (DRAIN) IMPLANT
EVACUATOR SILICONE 100CC (DRAIN) IMPLANT
FORCEPS BIPOLAR SPETZLER 8 1.0 (NEUROSURGERY SUPPLIES) ×3 IMPLANT
GAUZE 4X4 16PLY RFD (DISPOSABLE) IMPLANT
GAUZE SPONGE 4X4 12PLY STRL (GAUZE/BANDAGES/DRESSINGS) IMPLANT
GLOVE BIO SURGEON STRL SZ 6.5 (GLOVE) ×3 IMPLANT
GLOVE BIO SURGEON STRL SZ7 (GLOVE) IMPLANT
GLOVE BIO SURGEON STRL SZ7.5 (GLOVE) ×3 IMPLANT
GLOVE BIO SURGEONS STRL SZ 6.5 (GLOVE) ×3
GLOVE BIOGEL PI IND STRL 6.5 (GLOVE) IMPLANT
GLOVE BIOGEL PI IND STRL 7.0 (GLOVE) IMPLANT
GLOVE BIOGEL PI IND STRL 7.5 (GLOVE) ×1 IMPLANT
GLOVE BIOGEL PI INDICATOR 6.5 (GLOVE) ×6
GLOVE BIOGEL PI INDICATOR 7.0 (GLOVE) ×4
GLOVE BIOGEL PI INDICATOR 7.5 (GLOVE) ×4
GLOVE ECLIPSE 7.0 STRL STRAW (GLOVE) ×2 IMPLANT
GLOVE EXAM NITRILE LRG STRL (GLOVE) IMPLANT
GLOVE EXAM NITRILE XL STR (GLOVE) IMPLANT
GLOVE EXAM NITRILE XS STR PU (GLOVE) IMPLANT
GLOVE SURG SS PI 6.0 STRL IVOR (GLOVE) ×4 IMPLANT
GOWN STRL REUS W/ TWL LRG LVL3 (GOWN DISPOSABLE) ×2 IMPLANT
GOWN STRL REUS W/ TWL XL LVL3 (GOWN DISPOSABLE) IMPLANT
GOWN STRL REUS W/TWL 2XL LVL3 (GOWN DISPOSABLE) IMPLANT
GOWN STRL REUS W/TWL LRG LVL3 (GOWN DISPOSABLE) ×10
GOWN STRL REUS W/TWL XL LVL3 (GOWN DISPOSABLE)
HEMOSTAT POWDER KIT SURGIFOAM (HEMOSTASIS) ×7 IMPLANT
HEMOSTAT SURGICEL 2X14 (HEMOSTASIS) ×3 IMPLANT
HOOK DURA 1/2IN (MISCELLANEOUS) ×1 IMPLANT
IV NS 1000ML (IV SOLUTION)
IV NS 1000ML BAXH (IV SOLUTION) ×1 IMPLANT
KIT BASIN OR (CUSTOM PROCEDURE TRAY) ×3 IMPLANT
KIT DRAIN CSF ACCUDRAIN (MISCELLANEOUS) IMPLANT
KIT TURNOVER KIT B (KITS) ×3 IMPLANT
MARKER SPHERE PSV REFLC 13MM (MARKER) ×6 IMPLANT
NDL SPNL 18GX3.5 QUINCKE PK (NEEDLE) IMPLANT
NEEDLE HYPO 22GX1.5 SAFETY (NEEDLE) ×3 IMPLANT
NEEDLE SPNL 18GX3.5 QUINCKE PK (NEEDLE) IMPLANT
NS IRRIG 1000ML POUR BTL (IV SOLUTION) ×9 IMPLANT
PACK CRANIOTOMY CUSTOM (CUSTOM PROCEDURE TRAY) ×3 IMPLANT
PAD DRESSING TELFA 3X8 NADH (GAUZE/BANDAGES/DRESSINGS) IMPLANT
PATTIES SURGICAL .25X.25 (GAUZE/BANDAGES/DRESSINGS) IMPLANT
PATTIES SURGICAL .5 X.5 (GAUZE/BANDAGES/DRESSINGS) IMPLANT
PATTIES SURGICAL .5 X3 (DISPOSABLE) ×2 IMPLANT
PATTIES SURGICAL 1/4 X 3 (GAUZE/BANDAGES/DRESSINGS) IMPLANT
PATTIES SURGICAL 1X1 (DISPOSABLE) IMPLANT
PIN MAYFIELD SKULL DISP (PIN) ×3 IMPLANT
PLATE 1.5 6HOLE XLONG DBL Y (Plate) ×4 IMPLANT
PLATE 1.5/0.5 18.5MM BURR HOLE (Plate) ×2 IMPLANT
RUBBERBAND STERILE (MISCELLANEOUS) ×4 IMPLANT
SCREW SELF DRILL HT 1.5/4MM (Screw) ×26 IMPLANT
SET TUBING W/EXT DISP (INSTRUMENTS) ×1 IMPLANT
SPECIMEN JAR SMALL (MISCELLANEOUS) IMPLANT
SPONGE NEURO XRAY DETECT 1X3 (DISPOSABLE) ×4 IMPLANT
SPONGE SURGIFOAM ABS GEL 100 (HEMOSTASIS) ×3 IMPLANT
STAPLER VISISTAT 35W (STAPLE) ×3 IMPLANT
SUT ETHILON 3 0 FSL (SUTURE) IMPLANT
SUT ETHILON 3 0 PS 1 (SUTURE) IMPLANT
SUT MNCRL AB 3-0 PS2 18 (SUTURE) IMPLANT
SUT NURALON 4 0 TR CR/8 (SUTURE) ×5 IMPLANT
SUT SILK 0 TIES 10X30 (SUTURE) IMPLANT
SUT VIC AB 2-0 CP2 18 (SUTURE) ×5 IMPLANT
TIP STRAIGHT 25KHZ (INSTRUMENTS) ×1 IMPLANT
TOWEL GREEN STERILE (TOWEL DISPOSABLE) ×3 IMPLANT
TOWEL GREEN STERILE FF (TOWEL DISPOSABLE) ×3 IMPLANT
TRAY FOLEY MTR SLVR 16FR STAT (SET/KITS/TRAYS/PACK) ×3 IMPLANT
TUBE CONNECTING 12'X1/4 (SUCTIONS) ×1
TUBE CONNECTING 12X1/4 (SUCTIONS) ×2 IMPLANT
UNDERPAD 30X30 (UNDERPADS AND DIAPERS) ×1 IMPLANT
WATER STERILE IRR 1000ML POUR (IV SOLUTION) ×3 IMPLANT

## 2019-02-14 NOTE — Anesthesia Postprocedure Evaluation (Signed)
Anesthesia Post Note  Patient: Donna Delgado  Procedure(s) Performed: CRANIOTOMY HEMATOMA EVACUATION SUBDURAL (Left )     Patient location during evaluation: SICU Anesthesia Type: General Level of consciousness: sedated Pain management: pain level controlled Vital Signs Assessment: post-procedure vital signs reviewed and stable Respiratory status: patient remains intubated per anesthesia plan Cardiovascular status: stable Postop Assessment: no apparent nausea or vomiting Anesthetic complications: no    Last Vitals:  Vitals:   02/21/2019 2100 02/13/2019 2140  BP: (!) 79/49 (!) 112/59  Pulse: 66 63  Resp: 16 18  Temp:    SpO2: 100% 100%    Last Pain:  Vitals:   02/26/2019 1450  TempSrc:   PainSc: Asleep                 Ryan P Ellender

## 2019-02-14 NOTE — Anesthesia Preprocedure Evaluation (Signed)
Anesthesia Evaluation  Patient identified by MRN, date of birth, ID band Patient awake    Reviewed: Allergy & Precautions, NPO status , Patient's Chart, lab work & pertinent test results  Airway Mallampati: II  TM Distance: >3 FB Neck ROM: Full    Dental no notable dental hx.    Pulmonary neg pulmonary ROS, Current Smoker,    Pulmonary exam normal breath sounds clear to auscultation       Cardiovascular negative cardio ROS Normal cardiovascular exam Rhythm:Regular Rate:Normal     Neuro/Psych negative neurological ROS  negative psych ROS   GI/Hepatic negative GI ROS, Neg liver ROS,   Endo/Other  negative endocrine ROS  Renal/GU negative Renal ROS  negative genitourinary   Musculoskeletal negative musculoskeletal ROS (+)   Abdominal   Peds negative pediatric ROS (+)  Hematology negative hematology ROS (+)   Anesthesia Other Findings Subdural hematoma  Reproductive/Obstetrics negative OB ROS                             Anesthesia Physical  Anesthesia Plan  ASA: IV and emergent  Anesthesia Plan: General   Post-op Pain Management:    Induction: Intravenous  PONV Risk Score and Plan: 2 and Ondansetron, Midazolam and Treatment may vary due to age or medical condition  Airway Management Planned: Oral ETT  Additional Equipment: Arterial line  Intra-op Plan:   Post-operative Plan: Post-operative intubation/ventilation  Informed Consent: I have reviewed the patients History and Physical, chart, labs and discussed the procedure including the risks, benefits and alternatives for the proposed anesthesia with the patient or authorized representative who has indicated his/her understanding and acceptance.     Dental advisory given  Plan Discussed with: CRNA  Anesthesia Plan Comments:         Anesthesia Quick Evaluation

## 2019-02-14 NOTE — Brief Op Note (Signed)
02/23/2019  1:59 PM  PATIENT:  Donna Delgado  62 y.o. female  PRE-OPERATIVE DIAGNOSIS:  BRAIN TUMOR  POST-OPERATIVE DIAGNOSIS:  BRAIN TUMOR  PROCEDURE:  Procedure(s): CRANIOTOMY TUMOR EXCISION (Left) APPLICATION OF CRANIAL NAVIGATION (Left)  SURGEON:  Surgeon(s) and Role:    * Qamar Aughenbaugh, Joyice Faster, MD - Primary    * Consuella Lose, MD - Assisting  PHYSICIAN ASSISTANT:   ANESTHESIA:   general  EBL:  1200 mL   BLOOD ADMINISTERED:none  DRAINS: none   LOCAL MEDICATIONS USED:  LIDOCAINE   SPECIMEN:  Source of Specimen:  Left brain tumor  DISPOSITION OF SPECIMEN:  PATHOLOGY  COUNTS:  YES  TOURNIQUET:  * No tourniquets in log *  DICTATION: .Note written in EPIC  PLAN OF CARE: Admit to inpatient   PATIENT DISPOSITION:  PACU - hemodynamically stable.   Delay start of Pharmacological VTE agent (>24hrs) due to surgical blood loss or risk of bleeding: yes

## 2019-02-14 NOTE — Anesthesia Preprocedure Evaluation (Addendum)
Anesthesia Evaluation  Patient identified by MRN, date of birth, ID band Patient awake    Reviewed: Allergy & Precautions, NPO status , Patient's Chart, lab work & pertinent test results  Airway Mallampati: II  TM Distance: >3 FB Neck ROM: Full    Dental no notable dental hx.    Pulmonary neg pulmonary ROS, Current Smoker,    Pulmonary exam normal breath sounds clear to auscultation       Cardiovascular negative cardio ROS Normal cardiovascular exam Rhythm:Regular Rate:Normal     Neuro/Psych negative neurological ROS  negative psych ROS   GI/Hepatic negative GI ROS, Neg liver ROS,   Endo/Other  negative endocrine ROS  Renal/GU negative Renal ROS  negative genitourinary   Musculoskeletal negative musculoskeletal ROS (+)   Abdominal   Peds negative pediatric ROS (+)  Hematology negative hematology ROS (+)   Anesthesia Other Findings   Reproductive/Obstetrics negative OB ROS                             Anesthesia Physical Anesthesia Plan  ASA: III  Anesthesia Plan: General   Post-op Pain Management:    Induction: Intravenous  PONV Risk Score and Plan: 2 and Ondansetron, Midazolam and Treatment may vary due to age or medical condition  Airway Management Planned: Oral ETT  Additional Equipment: Arterial line  Intra-op Plan:   Post-operative Plan: Extubation in OR and Possible Post-op intubation/ventilation  Informed Consent: I have reviewed the patients History and Physical, chart, labs and discussed the procedure including the risks, benefits and alternatives for the proposed anesthesia with the patient or authorized representative who has indicated his/her understanding and acceptance.     Dental advisory given  Plan Discussed with: CRNA  Anesthesia Plan Comments:       Anesthesia Quick Evaluation

## 2019-02-14 NOTE — Transfer of Care (Signed)
Immediate Anesthesia Transfer of Care Note  Patient: Donna Delgado  Procedure(s) Performed: CRANIOTOMY TUMOR EXCISION (Left Head) APPLICATION OF CRANIAL NAVIGATION (Left Head)  Patient Location: PACU  Anesthesia Type:General  Level of Consciousness: drowsy  Airway & Oxygen Therapy: Patient Spontanous Breathing  Post-op Assessment: Report given to RN and Post -op Vital signs reviewed and stable  Post vital signs: Reviewed and stable  Last Vitals:  Vitals Value Taken Time  BP 157/88 02/17/2019 1417  Temp    Pulse 57 02/13/2019 1422  Resp 19 03/04/2019 1422  SpO2 98 % 03/12/2019 1422  Vitals shown include unvalidated device data.  Last Pain:  Vitals:   02/13/2019 0411  TempSrc:   PainSc: Asleep         Complications: No apparent anesthesia complications

## 2019-02-14 NOTE — Anesthesia Procedure Notes (Signed)
Procedure Name: Intubation Date/Time: 03/10/2019 4:56 PM Performed by: Okley Magnussen T, CRNA Pre-anesthesia Checklist: Patient identified, Emergency Drugs available, Suction available and Patient being monitored Oxygen Delivery Method: Circle system utilized Preoxygenation: Pre-oxygenation with 100% oxygen Induction Type: IV induction and Rapid sequence Laryngoscope Size: Miller and 2 Grade View: Grade I Tube type: Oral Tube size: 7.0 mm Number of attempts: 1 Airway Equipment and Method: Patient positioned with wedge pillow and Stylet Placement Confirmation: ETT inserted through vocal cords under direct vision,  positive ETCO2 and breath sounds checked- equal and bilateral Secured at: 21 cm Tube secured with: Tape Dental Injury: Teeth and Oropharynx as per pre-operative assessment

## 2019-02-14 NOTE — Transfer of Care (Signed)
Immediate Anesthesia Transfer of Care Note  Patient: Donna Delgado  Procedure(s) Performed: CRANIOTOMY HEMATOMA EVACUATION SUBDURAL (Left )  Patient Location: ICU  Anesthesia Type:General  Level of Consciousness: Patient remains intubated per anesthesia plan  Airway & Oxygen Therapy: Patient remains intubated per anesthesia plan and Patient placed on Ventilator (see vital sign flow sheet for setting)  Post-op Assessment: Report given to RN and Post -op Vital signs reviewed and stable  Post vital signs: Reviewed and stable  Last Vitals:  Vitals Value Taken Time  BP 147/86 02/23/2019 1850  Temp    Pulse 56 02/24/2019 1858  Resp 16 02/22/2019 1858  SpO2 100 % 03/15/2019 1858  Vitals shown include unvalidated device data.  Last Pain:  Vitals:   03/10/2019 1450  TempSrc:   PainSc: Asleep         Complications: No apparent anesthesia complications

## 2019-02-14 NOTE — Anesthesia Postprocedure Evaluation (Signed)
Anesthesia Post Note  Patient: Donna Delgado  Procedure(s) Performed: CRANIOTOMY TUMOR EXCISION (Left Head) APPLICATION OF CRANIAL NAVIGATION (Left Head)     Patient location during evaluation: PACU Anesthesia Type: General Level of consciousness: awake and alert Pain management: pain level controlled Vital Signs Assessment: post-procedure vital signs reviewed and stable Respiratory status: spontaneous breathing, nonlabored ventilation and respiratory function stable Cardiovascular status: blood pressure returned to baseline and stable Postop Assessment: no apparent nausea or vomiting Anesthetic complications: no    Last Vitals:  Vitals:   02/18/2019 0314 02/26/2019 1420  BP: (!) 151/74 (!) 157/88  Pulse: (!) 54 (!) 58  Resp: 16 11  Temp: 36.6 C 36.4 C  SpO2: 100% 99%    Last Pain:  Vitals:   03/01/2019 1450  TempSrc:   PainSc: Asleep                 Lynda Rainwater

## 2019-02-14 NOTE — Progress Notes (Signed)
I have reviewed the postoperative CT scan. This demonstrates reduction in size of the hematoma. There is significant surrounding edema which I suspect is a combination of cytotoxic and vasogenic edema. I do not see a need for repeat surgical evacuation as the actual hematoma appears to occupy the resection cavity. Will start her on 1cc/kg hypertonic saline and cont dex 10q6.

## 2019-02-14 NOTE — Progress Notes (Signed)
Patient arrive to 4N18 around 1830.  Per CRNA patient received paralytics around 1600.  Patient is unresponsive, bilateral pupils equal and nonreactive.  Neurosurgery at bedside and aware of neuro assessment.  MD will order STAT CT.  Report given to PM RN.      No patient belongings from [place].

## 2019-02-14 NOTE — Op Note (Signed)
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 She initially recovered well and was moving the left side spontaneously, but she became progressively more obtunded and CT showed a large hematoma in the resection bed. She was therefore taken to the OR emergently for hematoma evacuation.    OPERATIVE DETAIL: The patient was taken to the operating room and placed on the OR table in the supine position. A formal time out was performed with two patient identifiers and confirmed the operative site. Anesthesia was induced by the anesthesia team. The prior incision was sharply re-opened, bone flap quickly removed, dura opened, hematoma evacuated and brain decompressed. After rapid hematoma evacuation, we then took time to obtain hemostasis superficially and complete the superficial dissection. Attention was then returned to the resection bed, where there was diffuse bleeding along the cavity edges, from superficial to deep, concentrated at the medial portion of the cavity. The resection cavity was clearly expanded by the hematoma in all directions. All clot was evacuated and the walls were inspected systematically in a circumferential fashion to obtain hemostasis. After hemostasis was obtained, I prophylactically placed Surgicel to line the resection cavity to try to prevent a repeat hemorrhage, followed by a coating of Floseal. The dura was re-approximated, bone flap replaced, and incision closed in layers. All counts were correct x2 and the patient was returned to anesthesia with a  plan to remain intubated until her neurologic exam shows improvement.    EBL:  349mL   DRAINS: none   SPECIMENS: none   Judith Part, MD 02/17/2019 4:21 PM

## 2019-02-14 NOTE — Op Note (Signed)
PATIENT: Donna Delgado  DAY OF SURGERY: 02/16/2019   PRE-OPERATIVE DIAGNOSIS:  Intracranial tumor   POST-OPERATIVE DIAGNOSIS:  Intracranial tumor   PROCEDURE:  Left craniotomy for tumor resection, use of operating microscope and frameless stereotaxy   SURGEON:  Surgeon(s) and Role:    Judith Part, MD - Primary    Consuella Lose, MD - Assisting   ANESTHESIA: ETGA   BRIEF HISTORY: This is a 63 year old woman who presented with progressive significant right sided weakness and word finding difficulty. The patient was found to have a left sided fronto-parietal enhancing mass. Metastatic workup negative. This was discussed with the patient as well as risks, benefits, and alternatives and wished to proceed with surgical resection. In addition to the standard risks of surgery, we discussed the high likelihood of worsening strength and aphasia. Given the vascularity of the lesion and its location, I think that craniotomy is likely safer than a stereotactic needle biopsy due to the risk of hemorrhage and provides a chance to potentially resect enough tumor to make a significant difference in overall survival, assuming it is a HGG.   OPERATIVE DETAIL: The patient was taken to the operating room and placed on the OR table in the supine position. A formal time out was performed with two patient identifiers and confirmed the operative site. Anesthesia was induced by the anesthesia team. The [place] head holder was applied to the head and a registration array was attached to the [place]. The head was positioned turned to the right and with the convexity tilted towards the floor to encourage a natural dissection direction posterior and superior to allow me to enter the anterosuperior aspect of the tumor to avoid Wernicke's and primary motor cortex. This was co-registered with the patient's preoperative imaging, the fit appeared to be acceptable. Using frameless stereotaxy, the operative trajectory  was planned and the incision was marked. Hair was clipped with surgical clippers over the incision and the area was then prepped and draped in a sterile fashion.  A linear incision was placed in the left fronto-parietal convexity over the tumor. A standard craniotomy flap was turned, dura was opened, and anatomy confirmed with frameless stereotaxy. A trajectory was planned to enter the anterosuperior aspect of the tumor to avoid traversing Wernicke's and primary motor cortex. A small corticotomy was created and followed down to the lesion, which was encountered in the expected location. A sample was sent for frozen section and the lesion was then dissected circumferentially. Given the known highly vascular medial portion and its close association to important white matter tracts, the lesion was amputated with a base left at the posterior and medial aspect to see if this could safely be removed. Using stereotaxy, the posterior portion was becoming very close to the deep portions of the precentral gyrus. I attempted to leave a remnant in this region, but kept having copious bleeding and I was unable to obtain hemostasis using numerous different coagulant aids and cautery. After multiple attempts at this, there was still diffuse bleeding from the entire tumor surface so I had to remove the remaining tumor.   The cavity was inspected for residual tumor with no obvious tumor remaining. Hemostasis was much more easily obtained at this point. The dura was closed, bone flap replaced with titanium plates and screws, all instrument and sponge counts were correct, the incision was then closed in layers. The patient was then returned to anesthesia for emergence. No apparent complications at the completion of the procedure.  EBL:  1247mL   DRAINS: none   SPECIMENS: left frontoparietal brain tumor   Judith Part, MD 02/18/2019 8:48 AM

## 2019-02-14 NOTE — Progress Notes (Signed)
Pt initially moving R side spontaneously and purposefully when extubated in the OR, but has been progressively becoming more obtunded, stat CTH confirmed a hematoma in the tumor resection bed, will need to go back to the OR emergently for evacuation and hemostasis.   -OR for emergent left craniotomy for hematoma evacuation -anesthesia to intubate in PACU, GCS<8 -1g/kg mannitol while OR is being prepped

## 2019-02-14 NOTE — Anesthesia Procedure Notes (Signed)
Arterial Line Insertion Start/End07/14/2020 8:53 AM, 02/14/2019 8:56 AM Performed by: Audry Pili, MD, anesthesiologist  Patient location: Pre-op. Preanesthetic checklist: patient identified, IV checked, risks and benefits discussed, surgical consent, monitors and equipment checked, pre-op evaluation, timeout performed and anesthesia consent Lidocaine 1% used for infiltration and patient sedated Left, radial was placed Catheter size: 20 G Hand hygiene performed   Attempts: 2 (Previous attempts by CRNA on right radial unsuccessful) Procedure performed using ultrasound guided technique. Ultrasound Notes:anatomy identified, needle tip was noted to be adjacent to the nerve/plexus identified, no ultrasound evidence of intravascular and/or intraneural injection and image(s) printed for medical record Following insertion, dressing applied and Biopatch. Post procedure assessment: unchanged and normal  Patient tolerated the procedure well with no immediate complications.

## 2019-02-14 NOTE — Consult Note (Signed)
NAME:  Donna Delgado, MRN:  [id number], DOB:  1956/05/20, LOS: 4 ADMISSION DATE:  02/01/2019, CONSULTATION DATE:  03/06/2019 REFERRING MD:  Neurosurgery, CHIEF COMPLAINT:  weakness  Brief History   Donna Delgado is a 63 year old woman with no PMH, presented on 6/28 with subacute on chronic R arm weakness, found to have a 3.4 cm  frontoparietal enhancing mass with compression and vasogenic edema.  OR for tumor exision today complicated by post op hematoma, taken back for evacuation.   History of present illness     Other sx included R foot weakness, falls.  metastatic work-up with CT chest, abdomen, and pelvis to identify a possible primary malignancy.  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  Started on decadron on 6/29 7/2 OR Tumor resection 1200 EBL  Post operatively became more obtunded, CTH - hematoma at tumor resection bed.   Repeat crani for evacuation, 300EBL.   Non responsive post operatively, non reactive pupils.  NSG reviewed repeat scan following second surgery --  "significant surrounding edema which I suspect is a combination of cytotoxic and vasogenic edema. I do not see a need for repeat surgical evacuation as the actual hematoma appears to occupy the resection cavity. Will start her on 1cc/kg hypertonic saline and cont dex 10q6"   Past Medical History  No Known hx.  Emphysema on CT chest Smoker  Significant Hospital Events   Neurosurgery 7/2   Consults:    Procedures:  7/2 brain tumor excision 7/2 hematoma evacuation  Significant Diagnostic Tests:    Micro Data:    Antimicrobials:    Interim history/subjective:    Objective   Blood pressure (!) 88/56, pulse 81, temperature (!) 97 F (36.1 C), resp. rate 17, height 5\' 3"  (1.6 m), weight 45.4 kg, SpO2 100 %.    Vent Mode: PRVC FiO2 (%):  [60 %] 60 % Set Rate:   [16 bmp] 16 bmp Vt Set:  [420 mL] 420 mL PEEP:  [5 cmH20] 5 cmH20 Plateau Pressure:  [11 cmH20-13 cmH20] 13 cmH20   Intake/Output Summary (Last 24 hours) at 03/11/2019 2325 Last data filed at 02/22/2019 1811 Gross per 24 hour  Intake 3100 ml  Output 2597 ml  Net 503 ml   Filed Weights   02/07/2019 1015 03/13/2019 0807  Weight: 45.4 kg 45.4 kg    Examination: General:NAD, eyes closed, intubated HENT: Pupils about 53mm, non reactive Lungs:CTAB Cardiovascular: rrr no mgr Abdomen: nt, nd, nbs Extremities: normal extremities.   Neuro: Moves r arm to painful central stimuli.  Grimaces to light touch  Resolved Hospital Problem list    Assessment & Plan:  R frontotemporal brain mass, postop hematoma.  Decadron 10mg  q 6 cont Mannitol 20% 50g x 1 1530 7.445/34.8/273 at 2300 Started on hypertonic saline 3% Na checks q 6, increase if needed.   Intermittent Hypertension: received 10mg  hydral this eveng,   Hyperglycemia: in setting of steroid administration.  ISS  Smoker: on nicotine patch   Best practice:  Diet: NPO  Pain/Anxiety/Delirium protocol (if indicated): propofol VAP protocol (if indicated): yes DVT prophylaxis: heparin TID GI prophylaxis: Pepcid 20 daily  Glucose control: ISS Mobility: bed Code Status: Full Family Communication:  Disposition: ICU  Labs   CBC: Recent Labs  Lab 02/08/2019 1020 02/12/19 0723 02/13/19 0440 02/22/2019 0432 03/04/2019 1243  WBC 8.3 7.0 17.0* 14.7*  --  NEUTROABS 5.1  --   --   --   --   HGB 14.0 14.3 13.6 12.9 11.9*  HCT 42.4 43.8 40.7 39.3 35.0*  MCV 99.3 99.1 98.8 100.0  --   PLT 328 346 351 318  --     Basic Metabolic Panel: Recent Labs  Lab 01/18/2019 1020 02/12/19 0723 02/13/19 0440 03/03/2019 0432 02/18/2019 1243  NA 141 139 141 142 143  K 3.5 3.8 4.8 3.8 4.3  CL 109 105 107 108  --   CO2 22 25 26 26   --   GLUCOSE 124* 154* 140* 134*  --   BUN 8 9 14 15   --   CREATININE 0.64 0.74 0.72 0.71  --   CALCIUM 8.9 8.9 9.1  8.7*  --    GFR: Estimated Creatinine Clearance: 52.3 mL/min (by C-G formula based on SCr of 0.71 mg/dL). Recent Labs  Lab 01/25/2019 1020 02/12/19 0723 02/13/19 0440 03/12/2019 0432  WBC 8.3 7.0 17.0* 14.7*    Liver Function Tests: Recent Labs  Lab 02/03/2019 1020  AST 22  ALT 17  ALKPHOS 62  BILITOT 0.7  PROT 6.9  ALBUMIN 3.3*   No results for input(s): LIPASE, AMYLASE in the last 168 hours. No results for input(s): AMMONIA in the last 168 hours.  ABG    Component Value Date/Time   PHART 7.336 (L) 02/20/2019 1243   PCO2ART 46.1 03/07/2019 1243   PO2ART 202.0 (H) 03/03/2019 1243   HCO3 25.1 03/04/2019 1243   TCO2 27 03/11/2019 1243   ACIDBASEDEF 1.0 02/20/2019 1243   O2SAT 100.0 02/17/2019 1243     Coagulation Profile: Recent Labs  Lab 01/28/2019 1020  INR 1.1    Cardiac Enzymes: No results for input(s): CKTOTAL, CKMB, CKMBINDEX, TROPONINI in the last 168 hours.  HbA1C: No results found for: HGBA1C  CBG: Recent Labs  Lab 02/13/19 1651 02/13/19 2112 02/19/2019 0603 02/26/2019 1416 03/03/2019 2203  GLUCAP 143* 164* 141* 126* 177*    Review of Systems:   Unable to assess  Past Medical History  She,  has no past medical history on file.   Surgical History   History reviewed. No pertinent surgical history.   Social History   reports that she has been smoking. She has been smoking about 2.00 packs per day. She has never used smokeless tobacco. She reports current alcohol use of about 3.0 - 4.0 standard drinks of alcohol per week.   Family History   Her family history is not on file.   Allergies No Known Allergies   Home Medications  Prior to Admission medications   Medication Sig Start Date End Date Taking? Authorizing Provider  acetaminophen (TYLENOL) 500 MG tablet Take 1,000 mg by mouth every 6 (six) hours as needed for mild pain or headache.   Yes [provider]     Critical care time: 56

## 2019-02-14 NOTE — Progress Notes (Signed)
CT trip made with RN and NT patient placed on 100% FiO2 for transport no events to report.  Pt safely back in room 4N18 resting well on ventilator with FI02 returned to 60%.

## 2019-02-14 NOTE — Anesthesia Procedure Notes (Signed)
Procedure Name: Intubation Performed by: Milford Cage, CRNA Pre-anesthesia Checklist: Patient identified, Emergency Drugs available, Suction available and Patient being monitored Patient Re-evaluated:Patient Re-evaluated prior to induction Oxygen Delivery Method: Circle System Utilized Preoxygenation: Pre-oxygenation with 100% oxygen Induction Type: IV induction Ventilation: Mask ventilation without difficulty Laryngoscope Size: Miller and 2 Grade View: Grade I Tube type: Oral Tube size: 7.0 mm Number of attempts: 2 Airway Equipment and Method: Stylet Placement Confirmation: ETT inserted through vocal cords under direct vision,  positive ETCO2 and breath sounds checked- equal and bilateral Secured at: 21 cm Tube secured with: Tape Dental Injury: Teeth and Oropharynx as per pre-operative assessment

## 2019-02-14 NOTE — Progress Notes (Signed)
Neurosurgery Service Post-operative progress note  Assessment & Plan: 63 y.o. woman with prelim HGG s/p resection with post-op hematoma s/p emergent craniotomy and evacuation.   -admit to 4N ICU -keep intubated for airway protection -post-op CTH -MRI tomorrow to serve as baseline for residual tumor -AM labs -SBP<160  Judith Part  03/04/2019 6:19 PM

## 2019-02-14 NOTE — Brief Op Note (Signed)
02/19/2019  6:17 PM  PATIENT:  Donna Delgado  63 y.o. female  PRE-OPERATIVE DIAGNOSIS:  Post-operative intracerebral hematoma  POST-OPERATIVE DIAGNOSIS:  Post-operative intracerebral hematoma  PROCEDURE:  Procedure(s): CRANIOTOMY HEMATOMA EVACUATION SUBDURAL (Left)  SURGEON:  Surgeon(s) and Role:    * Judith Part, MD - Primary  PHYSICIAN ASSISTANT:   ASSISTANTS: Nundkumar   ANESTHESIA:   general  EBL:  300 mL   BLOOD ADMINISTERED:none  DRAINS: none   LOCAL MEDICATIONS USED:  LIDOCAINE   SPECIMEN:  No Specimen  DISPOSITION OF SPECIMEN:  N/A  COUNTS:  YES  TOURNIQUET:  * No tourniquets in log *  DICTATION: .Note written in EPIC  PLAN OF CARE: Admit to inpatient   PATIENT DISPOSITION:  ICU - intubated and critically ill.   Delay start of Pharmacological VTE agent (>24hrs) due to surgical blood loss or risk of bleeding: yes

## 2019-02-14 NOTE — Progress Notes (Signed)
Patient is off the unit for surgery.

## 2019-02-15 ENCOUNTER — Inpatient Hospital Stay (HOSPITAL_COMMUNITY): Payer: BC Managed Care – PPO

## 2019-02-15 ENCOUNTER — Inpatient Hospital Stay: Payer: Self-pay

## 2019-02-15 DIAGNOSIS — E43 Unspecified severe protein-calorie malnutrition: Secondary | ICD-10-CM | POA: Insufficient documentation

## 2019-02-15 LAB — GLUCOSE, CAPILLARY
Glucose-Capillary: 104 mg/dL — ABNORMAL HIGH (ref 70–99)
Glucose-Capillary: 116 mg/dL — ABNORMAL HIGH (ref 70–99)
Glucose-Capillary: 119 mg/dL — ABNORMAL HIGH (ref 70–99)
Glucose-Capillary: 121 mg/dL — ABNORMAL HIGH (ref 70–99)
Glucose-Capillary: 127 mg/dL — ABNORMAL HIGH (ref 70–99)
Glucose-Capillary: 133 mg/dL — ABNORMAL HIGH (ref 70–99)

## 2019-02-15 LAB — CBC
HCT: 25.6 % — ABNORMAL LOW (ref 36.0–46.0)
Hemoglobin: 8.4 g/dL — ABNORMAL LOW (ref 12.0–15.0)
MCH: 33.3 pg (ref 26.0–34.0)
MCHC: 32.8 g/dL (ref 30.0–36.0)
MCV: 101.6 fL — ABNORMAL HIGH (ref 80.0–100.0)
Platelets: 218 10*3/uL (ref 150–400)
RBC: 2.52 MIL/uL — ABNORMAL LOW (ref 3.87–5.11)
RDW: 14.9 % (ref 11.5–15.5)
WBC: 14.4 10*3/uL — ABNORMAL HIGH (ref 4.0–10.5)
nRBC: 0 % (ref 0.0–0.2)

## 2019-02-15 LAB — PHOSPHORUS: Phosphorus: 2.8 mg/dL (ref 2.5–4.6)

## 2019-02-15 LAB — SODIUM
Sodium: 145 mmol/L (ref 135–145)
Sodium: 154 mmol/L — ABNORMAL HIGH (ref 135–145)
Sodium: 157 mmol/L — ABNORMAL HIGH (ref 135–145)
Sodium: 156 mmol/L — ABNORMAL HIGH (ref 135–145)
Sodium: 157 mmol/L — ABNORMAL HIGH (ref 135–145)

## 2019-02-15 LAB — BASIC METABOLIC PANEL
Anion gap: 7 (ref 5–15)
BUN: 11 mg/dL (ref 8–23)
CO2: 21 mmol/L — ABNORMAL LOW (ref 22–32)
Calcium: 7.4 mg/dL — ABNORMAL LOW (ref 8.9–10.3)
Chloride: 120 mmol/L — ABNORMAL HIGH (ref 98–111)
Creatinine, Ser: 0.81 mg/dL (ref 0.44–1.00)
GFR calc Af Amer: >60 mL/min (ref >60)
GFR calc non Af Amer: >60 mL/min (ref >60)
Glucose, Bld: 121 mg/dL — ABNORMAL HIGH (ref 70–99)
Potassium: 3.7 mmol/L (ref 3.5–5.1)
Sodium: 148 mmol/L — ABNORMAL HIGH (ref 135–145)

## 2019-02-15 LAB — HEMOGLOBIN A1C
Hgb A1c MFr Bld: 5.5 % (ref 4.8–5.6)
Mean Plasma Glucose: 111.15 mg/dL

## 2019-02-15 LAB — MAGNESIUM: Magnesium: 2.3 mg/dL (ref 1.7–2.4)

## 2019-02-15 MED ORDER — CHLORHEXIDINE GLUCONATE CLOTH 2 % EX PADS: 6.0000 | MEDICATED_PAD | Freq: Every day | CUTANEOUS | Status: DC

## 2019-02-15 MED ORDER — SODIUM CHLORIDE 0.9% FLUSH
10.0000 mL | Freq: Two times a day (BID) | INTRAVENOUS | Status: DC
  Administered 2019-02-15 – 2019-02-24 (×17): 10 mL

## 2019-02-15 MED ORDER — GADOBUTROL 1 MMOL/ML IV SOLN
5.0000 mL | Freq: Once | INTRAVENOUS | Status: AC | PRN
  Administered 2019-02-15: 14:00:00 5 mL via INTRAVENOUS

## 2019-02-15 MED ORDER — SODIUM CHLORIDE 0.9% FLUSH: 10.0000 mL | INTRAVENOUS | Status: DC | PRN

## 2019-02-15 MED ORDER — PRO-STAT SUGAR FREE PO LIQD
30.0000 mL | Freq: Two times a day (BID) | ORAL | Status: DC
  Administered 2019-02-15: 30 mL
  Filled 2019-02-15: qty 30

## 2019-02-15 MED ORDER — DEXAMETHASONE SODIUM PHOSPHATE 10 MG/ML IJ SOLN
10.0000 mg | Freq: Four times a day (QID) | INTRAMUSCULAR | Status: DC
  Administered 2019-02-15 – 2019-02-19 (×17): 10 mg via INTRAVENOUS
  Filled 2019-02-15 (×17): qty 1

## 2019-02-15 MED ORDER — VITAL AF 1.2 CAL PO LIQD
1000.0000 mL | ORAL | Status: DC
  Administered 2019-02-15 – 2019-02-22 (×6): 1000 mL
  Filled 2019-02-15: qty 1000

## 2019-02-15 NOTE — Progress Notes (Signed)
Assisted tele visit to patient with family member.  Laron Boorman William, RN   

## 2019-02-15 NOTE — Progress Notes (Signed)
Peripherally Inserted Central Catheter/Midline Placement  The IV Nurse has discussed with the patient and/or persons authorized to consent for the patient, the purpose of this procedure and the potential benefits and risks involved with this procedure.  The benefits include less needle sticks, lab draws from the catheter, and the patient may be discharged home with the catheter. Risks include, but not limited to, infection, bleeding, blood clot (thrombus formation), and puncture of an artery; nerve damage and irregular heartbeat and possibility to perform a PICC exchange if needed/ordered by physician.  Alternatives to this procedure were also discussed.  Bard Power PICC patient education guide, fact sheet on infection prevention and patient information card has been provided to patient /or left at bedside.    PICC/Midline Placement Documentation  PICC Triple Lumen 02/15/19 PICC Right Cephalic 39 cm 0 cm (Active)  Indication for Insertion or Continuance of Line Vasoactive infusions 02/15/19 1611  Exposed Catheter (cm) 0 cm 02/15/19 1611  Site Assessment Dry;Intact;Clean 02/15/19 1611  Lumen #1 Status Flushed;Saline locked;Blood return noted 02/15/19 1611  Lumen #2 Status Flushed;Saline locked;Blood return noted 02/15/19 1611  Lumen #3 Status Flushed;Saline locked;Blood return noted 02/15/19 1611  Dressing Type Transparent 02/15/19 1611  Dressing Status Clean;Dry;Intact;Antimicrobial disc in place 02/15/19 1611  Dressing Change Due 02/22/19 02/15/19 1611       Gordan Payment 02/15/2019, 4:12 PM

## 2019-02-15 NOTE — Progress Notes (Signed)
Patient transported to MRI and returned to 4N18. No complications. Vitals stable at this time. Patient tolerated well. RT will continue to monitor.

## 2019-02-15 NOTE — Progress Notes (Signed)
NAME:  Donna Delgado, MRN:  [id number], DOB:  12-Apr-1956, LOS: 5 ADMISSION DATE:  02/08/2019, CONSULTATION DATE:  02/21/2019 REFERRING MD:  Neurosurgery, CHIEF COMPLAINT:  weakness  Brief History   Angelo Caroll is a 63 year old woman with no reported PMH, presented on 6/28 with subacute on chronic R arm weakness, found to have a 3.4 cm  frontoparietal enhancing mass with compression and vasogenic edema.  She was taken to OR for tumor exision 7/2 complicated by post op hematoma, taken back for evacuation same day.   History of present illness   Other sx included R foot weakness, falls.  Metastatic work-up with CT chest, abdomen, and pelvis to identify a possible primary malignancy.  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  Started on decadron on 6/29 7/2 OR Tumor resection with 1200 EBL  Post operatively became more obtunded, CTH - hematoma at tumor resection bed.   Repeat crani for evacuation, 300EBL.   Non responsive post operatively, non reactive pupils.  NSG reviewed repeat scan following second surgery --  "significant surrounding edema which I suspect is a combination of cytotoxic and vasogenic edema. I do not see a need for repeat surgical evacuation as the actual hematoma appears to occupy the resection cavity. Will start her on 1cc/kg hypertonic saline and cont dex 10q6"   Past Medical History  No Known hx.  Emphysema on CT chest Smoker  Significant Hospital Events   Neurosurgery 7/2   Consults:  PCCM 7/2  Procedures:  7/2 brain tumor excision 7/2 hematoma evacuation  Significant Diagnostic Tests:  7/2 CT Head-personally reviewed "IMPRESSION: 1. Regressed intracranial mass effect - including restored basilar cistern patency - following evacuation of postoperative hematoma. 2. Residual gas and blood in the resection cavity and  residual rightward midline shift of 12-14 mm (previously 18 mm). 3. Stable mild asymmetric enlargement of the right lateral ventricle. 4. No new intracranial abnormality."  7/3 MRI >>  Micro Data:  MRSA surveillance negative SARSCoV2 negative   Antimicrobials:  Cefazolin SCIP 7/2  Interim history/subjective:  Reported posturing overnight.  On CPAP. Remains on Hypertonic saline.  Review of Systems:   Unable to assess as pt is intubated and unresponsive   Objective   Blood pressure 134/73, pulse 65, temperature 99.5 F (37.5 C), temperature source Axillary, resp. rate 16, height 5\' 3"  (1.6 m), weight 45.4 kg, SpO2 100 %.    Vent Mode: CPAP;PSV FiO2 (%):  [40 %-60 %] 40 % Set Rate:  [16 bmp] 16 bmp Vt Set:  [420 mL] 420 mL PEEP:  [5 cmH20] 5 cmH20 Pressure Support:  [10 cmH20] 10 cmH20 Plateau Pressure:  [11 cmH20-16 cmH20] 12 cmH20   Intake/Output Summary (Last 24 hours) at 02/15/2019 0857 Last data filed at 02/15/2019 0800 Gross per 24 hour  Intake 3847.69 ml  Output 3345 ml  Net 502.69 ml   Filed Weights   01/26/2019 1015 03/01/2019 0807  Weight: 45.4 kg 45.4 kg    Examination: General: well developed, well nourished, critically ill appearing lady HENT: L scalp incision site CDI, slight bulge. Pupils 46mm minimally reactive. MMM Lungs: BBS present, clear, FNL. Symmetrical. Vent supported Cardiovascular: RRR S1 S2. 1/6 murmur. No Rub/gallops Abdomen: + BS x4. SNT/ND Extremities: no edema noted.  Neuro: delayed withdrawal to noxious stimuli x4. + cough/gag/corneal reflex Resolved Hospital Problem list   N/A  Assessment &  Plan:  R frontotemporal brain mass, postop hematoma.  Decadron 10mg  q 6h-cont per NSGY Continue hypertonic saline 3% Na checks q 6 HOB elevated Maintain euglycemia, normothermia MRI brain pending  Neuro checks At risk for DI   Acute post operative respiratory failure 2/2 above Prn analgesia/sedation/anxiolysis Continue vent support to  prevent hypoxia and hypercarbia VAP bundle SBT as able  Acute blood loss anemia Consider transfusion for Hgb <7 and/or symptomatic Follow CBC   Intermittent Hypertension Prn anti HTN for now   Hyperglycemia: in setting of steroid administration.  SSI   Smoker:  continue nicotine patch   Best practice:  Diet: NPO. TF per recs today   Pain/Anxiety/Delirium protocol (if indicated): propofol, prn analgesia VAP protocol (if indicated): yes DVT prophylaxis: SCDs. No chemo d/t bleeding GI prophylaxis: PPI Glucose control: SSI Mobility: bed rest  Code Status: Full Family Communication: will update  Disposition: ICU  Labs/imaging reviewed in detail in [place], MSN, AGACNP  [facility]

## 2019-02-15 NOTE — Progress Notes (Signed)
Video call completed with daughter, [name].

## 2019-02-15 NOTE — Progress Notes (Signed)
Called MRI to see if patient can get her MRI now, but was told it cannot be possible at this time. MRI tech will let this writer know she patient can go down for scan.

## 2019-02-15 NOTE — Progress Notes (Signed)
  NEUROSURGERY PROGRESS NOTE   Pt seen and examined. No issues overnight.  EXAM: Temp:  [97 F (36.1 C)-99.6 F (37.6 C)] 99.5 F (37.5 C) (07/03 0800) Pulse Rate:  [44-93] 66 (07/03 1000) Resp:  [11-24] 15 (07/03 1000) BP: (79-167)/(49-88) 122/60 (07/03 1103) SpO2:  [99 %-100 %] 100 % (07/03 1000) Arterial Line BP: (73-179)/(38-95) 154/80 (07/03 1000) FiO2 (%):  [40 %-60 %] 40 % (07/03 1103) Intake/Output      07/02 0701 - 07/03 0700 07/03 0701 - 07/04 0700   P.O.     I.V. (mL/kg) 2962.7 (65.3) 254.9 (5.6)   IV Piggyback 800    Total Intake(mL/kg) 3762.7 (82.9) 254.9 (5.6)   Urine (mL/kg/hr) 1846 (1.7)    Stool 1    Blood 1500    Total Output 3347    Net +415.7 +254.9         No eye opening to pain Pupils sluggishly reactive OU (+)corneals (+) doll's eyes (+) cough Breathing over vent Minimal RUE flexion to central pain  LABS: Lab Results  Component Value Date   CREATININE 0.81 02/15/2019   BUN 11 02/15/2019   NA 154 (H) 02/15/2019   K 3.7 02/15/2019   CL 120 (H) 02/15/2019   CO2 21 (L) 02/15/2019   Lab Results  Component Value Date   WBC 14.4 (H) 02/15/2019   HGB 8.4 (L) 02/15/2019   HCT 25.6 (L) 02/15/2019   MCV 101.6 (H) 02/15/2019   PLT 218 02/15/2019     IMPRESSION: - 63 y.o. female POD#1 s/p resection left frontal tumor and subsequent hematoma evacuation. Remains minimally conscious with perhaps minimal improvement in exam after hematoma evacuation  PLAN: - Will get MRI today - Cont supportive care - Cont hypertonic saline @75cc , monitor Na. Can decrease if repeat Na is > 128mEq/L - Cont dex

## 2019-02-15 NOTE — Progress Notes (Signed)
Initial Nutrition Assessment  DOCUMENTATION CODES:   Underweight, Severe malnutrition in context of chronic illness  INTERVENTION:   Initiate  Vital AF 1.2 @ 45 ml/hr  Provides: 1296 kcal, 81 grams protein, and 875 ml free water.   Monitor magnesium and phosphorus every 12 hours x 4 occurences, MD to replete as needed, as pt is at risk for refeeding syndrome given severe malnutrition.  NUTRITION DIAGNOSIS:   Severe Malnutrition related to chronic illness(smoker, possible cancer) as evidenced by severe fat depletion, severe muscle depletion.  GOAL:   Patient will meet greater than or equal to 90% of their needs  MONITOR:   TF tolerance, Vent status  REASON FOR ASSESSMENT:   Consult, Ventilator Enteral/tube feeding initiation and management  ASSESSMENT:   Pt with no PMH (smoker and emphysema noted on CT) admitted on 6/28 with subacute on chronic R arm weakness found to have a 3.4 cm frontoparietal mass. 7/2 s/p tumor excision complicated by post op hematoma s/p evacuation.   Pt discussed during ICU rounds and with RN.  Patient is currently intubated on ventilator support MV: 7 L/min Temp (24hrs), Avg:99 F (37.2 C), Min:97 F (36.1 C), Max:100.5 F (38.1 C)  Medications reviewed and include: decadron, SSI, hypertonic saline  Labs reviewed: Na 154 (H)   EBL: 1500 ml   NUTRITION - FOCUSED PHYSICAL EXAM:    Most Recent Value  Orbital Region  Severe depletion  Upper Arm Region  Severe depletion  Thoracic and Lumbar Region  Severe depletion  Buccal Region  Unable to assess  Temple Region  Unable to assess  Clavicle Bone Region  Severe depletion  Clavicle and Acromion Bone Region  Severe depletion  Scapular Bone Region  Unable to assess  Dorsal Hand  Unable to assess  Patellar Region  Severe depletion  Anterior Thigh Region  Severe depletion  Posterior Calf Region  Severe depletion  Edema (RD Assessment)  None  Hair  Unable to assess  Eyes  Unable to assess   Mouth  Unable to assess  Skin  Reviewed  Nails  Reviewed [pale]       Diet Order:   Diet Order            Diet NPO time specified  Diet effective now              EDUCATION NEEDS:   No education needs have been identified at this time  Skin:  Skin Assessment: Reviewed RN Assessment  Last BM:  7/2  Height:   Ht Readings from Last 1 Encounters:  02/16/2019 5\' 3"  (1.6 m)    Weight:   Wt Readings from Last 1 Encounters:  02/22/2019 45.4 kg    Ideal Body Weight:  52.2 kg  BMI:  Body mass index is 17.71 kg/m.  Estimated Nutritional Needs:   Kcal:  1315  Protein:  70-85 grams  Fluid:  > 1.5 L/day  Maylon Peppers RD, LDN, CNSC [phone] Pager [phone] After Hours Pager

## 2019-02-16 LAB — CBC
HCT: 28.6 % — ABNORMAL LOW (ref 36.0–46.0)
Hemoglobin: 8.8 g/dL — ABNORMAL LOW (ref 12.0–15.0)
MCH: 32.8 pg (ref 26.0–34.0)
MCHC: 30.8 g/dL (ref 30.0–36.0)
MCV: 106.7 fL — ABNORMAL HIGH (ref 80.0–100.0)
Platelets: 196 10*3/uL (ref 150–400)
RBC: 2.68 MIL/uL — ABNORMAL LOW (ref 3.87–5.11)
RDW: 15.4 % (ref 11.5–15.5)
WBC: 14.4 10*3/uL — ABNORMAL HIGH (ref 4.0–10.5)
nRBC: 0 % (ref 0.0–0.2)

## 2019-02-16 LAB — GLUCOSE, CAPILLARY
Glucose-Capillary: 123 mg/dL — ABNORMAL HIGH (ref 70–99)
Glucose-Capillary: 126 mg/dL — ABNORMAL HIGH (ref 70–99)
Glucose-Capillary: 132 mg/dL — ABNORMAL HIGH (ref 70–99)
Glucose-Capillary: 115 mg/dL — ABNORMAL HIGH (ref 70–99)
Glucose-Capillary: 155 mg/dL — ABNORMAL HIGH (ref 70–99)
Glucose-Capillary: 112 mg/dL — ABNORMAL HIGH (ref 70–99)

## 2019-02-16 LAB — SODIUM
Sodium: 160 mmol/L — ABNORMAL HIGH (ref 135–145)
Sodium: 158 mmol/L — ABNORMAL HIGH (ref 135–145)
Sodium: 157 mmol/L — ABNORMAL HIGH (ref 135–145)

## 2019-02-16 LAB — BASIC METABOLIC PANEL
Anion gap: 5 (ref 5–15)
BUN: 11 mg/dL (ref 8–23)
CO2: 23 mmol/L (ref 22–32)
Calcium: 8.6 mg/dL — ABNORMAL LOW (ref 8.9–10.3)
Chloride: 127 mmol/L — ABNORMAL HIGH (ref 98–111)
Creatinine, Ser: 0.65 mg/dL (ref 0.44–1.00)
GFR calc Af Amer: >60 mL/min (ref >60)
GFR calc non Af Amer: >60 mL/min (ref >60)
Glucose, Bld: 151 mg/dL — ABNORMAL HIGH (ref 70–99)
Potassium: 3.4 mmol/L — ABNORMAL LOW (ref 3.5–5.1)
Sodium: 155 mmol/L — ABNORMAL HIGH (ref 135–145)

## 2019-02-16 LAB — PHOSPHORUS
Phosphorus: 1.7 mg/dL — ABNORMAL LOW (ref 2.5–4.6)
Phosphorus: 2.7 mg/dL (ref 2.5–4.6)

## 2019-02-16 LAB — MAGNESIUM
Magnesium: 2.5 mg/dL — ABNORMAL HIGH (ref 1.7–2.4)
Magnesium: 2.7 mg/dL — ABNORMAL HIGH (ref 1.7–2.4)

## 2019-02-16 LAB — OSMOLALITY: Osmolality: 332 mOsm/kg (ref 275–295)

## 2019-02-16 MED ORDER — POTASSIUM PHOSPHATES 15 MMOLE/5ML IV SOLN
15.0000 mmol | Freq: Once | INTRAVENOUS | Status: AC
  Administered 2019-02-16: 16:00:00 15 mmol via INTRAVENOUS
  Filled 2019-02-16: qty 5

## 2019-02-16 MED ORDER — SODIUM CHLORIDE 3 % IV SOLN
INTRAVENOUS | Status: AC
  Administered 2019-02-16 (×2): 30 mL/h via INTRAVENOUS
  Filled 2019-02-16 (×2): qty 500

## 2019-02-16 NOTE — Progress Notes (Signed)
Assisted tele visit to patient with daughter. ° °Fredis Malkiewicz P, RN  °

## 2019-02-16 NOTE — Progress Notes (Signed)
Neurosurgery paged to notify about patient's osmolality result. Awaiting response.

## 2019-02-16 NOTE — Progress Notes (Signed)
Restarted 3% at 30cc/hr and changed Na lab draws to q6, per Dr. Zada Finders.

## 2019-02-16 NOTE — Progress Notes (Signed)
NAME:  Donna Delgado, MRN:  [id number], DOB:  30-Aug-1955, LOS: 6 ADMISSION DATE:  02/04/2019, CONSULTATION DATE:  02/15/2019 REFERRING MD:  Neurosurgery, CHIEF COMPLAINT:  weakness  Brief History   Donna Delgado is a 63 year old woman with no reported PMH, presented on 6/28 with subacute on chronic R arm weakness, found to have a 3.4 cm  frontoparietal enhancing mass with compression and vasogenic edema.  She was taken to OR for tumor exision 7/2 complicated by post op hematoma, taken back for evacuation same day.   History of present illness   Other sx included R foot weakness, falls.  Metastatic work-up with CT chest, abdomen, and pelvis to identify a possible primary malignancy.  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  Started on decadron on 6/29 7/2 OR Tumor resection with 1200 EBL  Post operatively became more obtunded, CTH - hematoma at tumor resection bed.   Repeat crani for evacuation, 300EBL.   Non responsive post operatively, non reactive pupils.  NSG reviewed repeat scan following second surgery --  "significant surrounding edema which I suspect is a combination of cytotoxic and vasogenic edema. I do not see a need for repeat surgical evacuation as the actual hematoma appears to occupy the resection cavity. Will start her on 1cc/kg hypertonic saline and cont dex 10q6"   Past Medical History  No Known hx.  Emphysema on CT chest Smoker  Significant Hospital Events   Neurosurgery 7/2   Consults:  PCCM 7/2  Procedures:  7/2 brain tumor excision 7/2 hematoma evacuation  Significant Diagnostic Tests:  7/2 CT Head-personally reviewed IMPRESSION: 1. Regressed intracranial mass effect - including restored basilar cistern patency - following evacuation of postoperative hematoma. 2. Residual gas and blood in the resection cavity and  residual rightward midline shift of 12-14 mm (previously 18 mm). 3. Stable mild asymmetric enlargement of the right lateral ventricle. 4. No new intracranial abnormality."  7/3 MRI >>Patient has had left frontoparietal craniotomy for tumor resection and subsequently for hematoma evacuation. Residual hematoma on the order of 5.5 cm in diameter. This study does not show any tissue that enhances additionally after today's gadolinium administration. T1 bright tissue in the region of surgery prior to contrast administration is felt to represent blood products. However, 1 might consider additional short-term follow-up in a few weeks to reassess for the potential of enhancing tissue. As discussed above, possibility of retained gadolinium in some residual tumor was considered, but would be unusual.  Mass effect with left-to-right shift of 11 mm, reduced from most recent head CT where it measured 13 mm.   Micro Data:  MRSA surveillance negative SARSCoV2 negative   Antimicrobials:  Cefazolin SCIP 7/2  Interim history/subjective:  No changes, no events overnight. Pt remains relatively unresponsive. To MRI yesterday, uneventful  Review of Systems:   Unable to assess as pt is intubated and unresponsive   Objective   Blood pressure 123/62, pulse 64, temperature 97.8 F (36.6 C), temperature source Oral, resp. rate 16, height 5\' 3"  (1.6 m), weight 50.3 kg, SpO2 100 %.    Vent Mode: PRVC FiO2 (%):  [30 %-40 %] 30 % Set Rate:  [16 bmp] 16 bmp Vt Set:  [420 mL] 420 mL PEEP:  [5 cmH20] 5 cmH20 Plateau Pressure:  [12 cmH20-18 cmH20] 12 cmH20   Intake/Output Summary (Last 24 hours) at 02/16/2019 8590 Last data  filed at 02/16/2019 0400 Gross per 24 hour  Intake 556.53 ml  Output 1075 ml  Net -518.47 ml   Filed Weights   01/20/2019 1015 03/14/2019 0807 02/16/19 0347  Weight: 45.4 kg 45.4 kg 50.3 kg    Examination: General: well developed, well nourished, critically ill appearing lady  HENT: L scalp incision site CDI, slight bulge. Pupils 2 mm minimally reactive. MMM Lungs: CTAB. Symmetrical. Vent supported Cardiovascular: RRR no r/m/g Abdomen: + BS x4. SNT/ND Extremities: no edema noted.  Neuro: delayed withdrawal to noxious stimuli x4. + cough/gag/corneal reflex   Resolved Hospital Problem list   N/A  Assessment & Plan:  R frontotemporal brain mass, postop hematoma.  Decadron 10mg  q 6h-cont per NSGY Continue hypertonic saline 3% Na checks q 6 HOB elevated Maintain euglycemia, normothermia Neuro checks At risk for DI   Acute post operative respiratory failure 2/2 above Prn analgesia/sedation/anxiolysis Continue vent support to prevent hypoxia and hypercarbia VAP bundle SBT as able  Acute blood loss anemia Consider transfusion for Hgb <7 and/or symptomatic Follow CBC   Intermittent Hypertension Prn anti HTN for now   Hyperglycemia: in setting of steroid administration.  SSI   Smoker:  continue nicotine patch   Best practice:  Diet: NPO. TF per recs today   Pain/Anxiety/Delirium protocol (if indicated): propofol, prn analgesia VAP protocol (if indicated): yes DVT prophylaxis: SCDs. No chemo d/t bleeding GI prophylaxis: PPI Glucose control: SSI Mobility: bed rest  Code Status: Full Family Communication: will update  Disposition: ICU  Labs/imaging reviewed in detail in EMR    I have independently seen and examined the patient, reviewed data, and developed an assessment and plan. A total of 38 minutes were spent in critical care assessment and medical decision making. This critical care time does not reflect procedure time, or teaching time or supervisory time of PA/NP/Med student/Med Resident, etc but could involve care discussion time.  Bonna Gains, MD PhD 02/16/19 8:19 AM

## 2019-02-16 NOTE — Progress Notes (Signed)
PT Cancellation Note  Patient Details Name: Donna Delgado MRN: [id number] DOB: [birth year]   Cancelled Treatment:    Reason Eval/Treat Not Completed: Patient not medically ready. Pt remains intubated and unresponsive post surgery. Not appropriate for physical therapy at this time. PT signing off but please re-consult if status improves.   Ellamae Sia, PT, DPT Acute Rehabilitation Services Pager [phone] Office [phone]    Willy Eddy 02/16/2019, 11:43 AM

## 2019-02-16 NOTE — Progress Notes (Signed)
Neurosurgery Service Progress Note  Subjective: No acute events overnight  Objective: Vitals:   02/16/19 0515 02/16/19 0600 02/16/19 0700 02/16/19 0756  BP: 140/61 137/63 (!) 143/63 123/62  Pulse: 64 68 (!) 58 64  Resp: '15 17 17 16  ' Temp:      TempSrc:      SpO2: 100% 100% 100%   Weight:      Height:       Temp (24hrs), Avg:98.9 F (37.2 C), Min:97.8 F (36.6 C), Max:100.5 F (38.1 C)  CBC Latest Ref Rng & Units 02/16/2019 02/15/2019 02/22/2019  WBC 4.0 - 10.5 K/uL 14.4(H) 14.4(H) -  Hemoglobin 12.0 - 15.0 g/dL 8.8(L) 8.4(L) 8.8(L)  Hematocrit 36.0 - 46.0 % 28.6(L) 25.6(L) 26.0(L)  Platelets 150 - 400 K/uL 196 218 -   BMP Latest Ref Rng & Units 02/16/2019 02/16/2019 02/15/2019  Glucose 70 - 99 mg/dL 151(H) - -  BUN 8 - 23 mg/dL 11 - -  Creatinine 0.44 - 1.00 mg/dL 0.65 - -  Sodium 135 - 145 mmol/L 155(H) 160(H) 157(H)  Potassium 3.5 - 5.1 mmol/L 3.4(L) - -  Chloride 98 - 111 mmol/L 127(H) - -  CO2 22 - 32 mmol/L 23 - -  Calcium 8.9 - 10.3 mg/dL 8.6(L) - -    Intake/Output Summary (Last 24 hours) at 02/16/2019 0804 Last data filed at 02/16/2019 0400 Gross per 24 hour  Intake 556.53 ml  Output 1075 ml  Net -518.47 ml    Current Facility-Administered Medications:  .  0.9 %  sodium chloride infusion, , Intravenous, Continuous, Mellissa Conley, Joyice Faster, MD, Stopped at 02/15/19 1327 .  acetaminophen (TYLENOL) tablet 650 mg, 650 mg, Oral, Q4H PRN **OR** acetaminophen (TYLENOL) suppository 650 mg, 650 mg, Rectal, Q4H PRN, Anuoluwapo Mefferd A, MD .  chlorhexidine gluconate (MEDLINE KIT) (PERIDEX) 0.12 % solution 15 mL, 15 mL, Mouth Rinse, BID, Deterding, Guadelupe Sabin, MD, 15 mL at 02/16/19 0717 .  Chlorhexidine Gluconate Cloth 2 % PADS 6 each, 6 each, Topical, Daily, Judith Part, MD, 6 each at 03/14/2019 1938 .  dexamethasone (DECADRON) injection 10 mg, 10 mg, Intravenous, Q6H, Costella, Vincent J, PA-C, 10 mg at 02/16/19 0510 .  feeding supplement (VITAL AF 1.2 CAL) liquid 1,000 mL,  1,000 mL, Per Tube, Continuous, Judith Part, MD, Last Rate: 45 mL/hr at 02/15/19 2017, 1,000 mL at 02/15/19 2017 .  fentaNYL 2529mg in NS 2542m(1072mml) infusion-PREMIX, 0-400 mcg/hr, Intravenous, Continuous, Oswaldo Cueto A, MD .  hydrALAZINE (APRESOLINE) injection 10 mg, 10 mg, Intravenous, Q1H PRN, OstJudith PartD, 10 mg at 02/16/19 0508 .  HYDROmorphone (DILAUDID) injection 0.5 mg, 0.5 mg, Intravenous, Q3H PRN, OstJudith PartD, 0.5 mg at 02/16/19 0725 .  insulin aspart (novoLOG) injection 0-9 Units, 0-9 Units, Subcutaneous, Q4H, Deterding, EliGuadelupe SabinD, 1 Units at 02/16/19 [id number] labetalol (NORMODYNE) injection 10-40 mg, 10-40 mg, Intravenous, Q10 min PRN, OstJudith PartD, 10 mg at 02/15/19 0207 .  MEDLINE mouth rinse, 15 mL, Mouth Rinse, 10 times per day, Deterding, EliGuadelupe SabinD, 15 mL at 02/16/19 0537 .  nicotine (NICODERM CQ - dosed in mg/24 hours) patch 21 mg, 21 mg, Transdermal, Daily, OstJudith PartD, 21 mg at 02/15/19 1023 .  ondansetron (ZOFRAN) tablet 4 mg, 4 mg, Oral, Q4H PRN **OR** ondansetron (ZOFRAN) injection 4 mg, 4 mg, Intravenous, Q4H PRN, Dorthia Tout A, MD .  oxyCODONE (Oxy IR/ROXICODONE) immediate release tablet 10 mg, 10 mg, Oral, Q4H PRN, OstEmelda Brothers  A, MD .  oxyCODONE (Oxy IR/ROXICODONE) immediate release tablet 5 mg, 5 mg, Oral, Q4H PRN, Judith Part, MD .  pantoprazole (PROTONIX) injection 40 mg, 40 mg, Intravenous, Daily, Deterding, Guadelupe Sabin, MD, 40 mg at 02/15/19 1022 .  promethazine (PHENERGAN) tablet 12.5-25 mg, 12.5-25 mg, Oral, Q4H PRN, Keison Glendinning A, MD .  propofol (DIPRIVAN) 1000 MG/100ML infusion, 5-80 mcg/kg/min, Intravenous, Titrated, Tanyiah Laurich A, MD .  remifentanil (ULTIVA) 5000 mcg in 250 mL normal saline (20 mcg/mL) for ICU use, 0.05 mcg/kg/min, Intravenous, Continuous, Chessie Neuharth, Joyice Faster, MD, Stopped at 03/15/2019 1352 .  senna-docusate (Senokot-S) tablet 1 tablet, 1  tablet, Oral, QHS PRN, Ameerah Huffstetler A, MD .  sodium chloride (hypertonic) 3 % solution, , Intravenous, Continuous, Dale Ribeiro A, MD, Last Rate: 30 mL/hr at 02/16/19 0730, 30 mL/hr at 02/16/19 0730 .  sodium chloride flush (NS) 0.9 % injection 10-40 mL, 10-40 mL, Intracatheter, Q12H, Taija Mathias, Joyice Faster, MD, 10 mL at 02/15/19 2240 .  sodium chloride flush (NS) 0.9 % injection 10-40 mL, 10-40 mL, Intracatheter, PRN, Judith Part, MD   Physical Exam: Intubated, no sedation, eyes open to stim, PERRL, gaze neutral Decorticate on left, decerebrate on right Incision c/d/i  Assessment and Plan: 63 y.o. 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 7/2 s/p craniotomy for tumor resection, post-op normal recovery from ETGA then progressive loss of exam, CTH w/ large post-op hematoma s/p emergent clot evac.  -her exam has improved, but I am very disappointed that it is still quite poor. Her shift on the MRI yesterday was improved from the post-op CT, likely from  decreasing edema now that tumor / hematoma are no longer present. The MRI did show a complete resection of the tumor, but that is little solace at this point.  -will update her family, I think that we should give her more time to recover, but I am very pessimistic that she will be able to return to a functional outcome. Her weakness preop was significant and included atrophy. 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 -continue 3%, can decrease the rate to 30cc/hr and see where she levels out, cont q6h Na checks -continue dex 10q6  Judith Part  02/16/19 8:04 AM

## 2019-02-16 NOTE — Progress Notes (Signed)
Sodium osmolality of 322 called on the patient, E-link MD notified

## 2019-02-16 NOTE — Progress Notes (Signed)
PA called back and said to stop 3% for now. Will continue to monitor.

## 2019-02-17 ENCOUNTER — Inpatient Hospital Stay (HOSPITAL_COMMUNITY): Payer: BC Managed Care – PPO

## 2019-02-17 DIAGNOSIS — J9600 Acute respiratory failure, unspecified whether with hypoxia or hypercapnia: Secondary | ICD-10-CM | POA: Diagnosis not present

## 2019-02-17 DIAGNOSIS — G9389 Other specified disorders of brain: Secondary | ICD-10-CM | POA: Diagnosis not present

## 2019-02-17 LAB — POCT I-STAT 4, (NA,K, GLUC, HGB,HCT)
Glucose, Bld: 155 mg/dL — ABNORMAL HIGH (ref 70–99)
HCT: 25 % — ABNORMAL LOW (ref 36.0–46.0)
Hemoglobin: 8.5 g/dL — ABNORMAL LOW (ref 12.0–15.0)
Potassium: 4.1 mmol/L (ref 3.5–5.1)
Sodium: 140 mmol/L (ref 135–145)

## 2019-02-17 LAB — GLUCOSE, CAPILLARY
Glucose-Capillary: 154 mg/dL — ABNORMAL HIGH (ref 70–99)
Glucose-Capillary: 126 mg/dL — ABNORMAL HIGH (ref 70–99)
Glucose-Capillary: 107 mg/dL — ABNORMAL HIGH (ref 70–99)
Glucose-Capillary: 154 mg/dL — ABNORMAL HIGH (ref 70–99)
Glucose-Capillary: 127 mg/dL — ABNORMAL HIGH (ref 70–99)

## 2019-02-17 LAB — MAGNESIUM: Magnesium: 2.5 mg/dL — ABNORMAL HIGH (ref 1.7–2.4)

## 2019-02-17 LAB — PHOSPHORUS: Phosphorus: 2.5 mg/dL (ref 2.5–4.6)

## 2019-02-17 LAB — SODIUM
Sodium: 158 mmol/L — ABNORMAL HIGH (ref 135–145)
Sodium: 159 mmol/L — ABNORMAL HIGH (ref 135–145)
Sodium: 162 mmol/L (ref 135–145)

## 2019-02-17 MED ORDER — SODIUM CHLORIDE 0.9 % IV SOLN: 0.0500 ug/kg/min | INTRAVENOUS | Status: DC

## 2019-02-17 MED ORDER — LORAZEPAM 2 MG/ML IJ SOLN: 2.0000 mg | INTRAMUSCULAR | Status: DC | PRN

## 2019-02-17 MED ORDER — LEVETIRACETAM IN NACL 1000 MG/100ML IV SOLN
1000.0000 mg | Freq: Two times a day (BID) | INTRAVENOUS | Status: DC
  Administered 2019-02-17 – 2019-02-24 (×14): 1000 mg via INTRAVENOUS
  Filled 2019-02-17 (×14): qty 100

## 2019-02-17 MED ORDER — SODIUM CHLORIDE 3 % IV SOLN
INTRAVENOUS | Status: AC
  Filled 2019-02-17: qty 500

## 2019-02-17 MED ORDER — SODIUM CHLORIDE 3 % IV SOLN
INTRAVENOUS | Status: DC
  Administered 2019-02-17: 30 mL/h via INTRAVENOUS
  Filled 2019-02-17 (×2): qty 500

## 2019-02-17 MED ORDER — LORAZEPAM 2 MG/ML IJ SOLN
INTRAMUSCULAR | Status: AC
  Filled 2019-02-17: qty 1

## 2019-02-17 MED ORDER — LORAZEPAM 2 MG/ML IJ SOLN
2.0000 mg | INTRAMUSCULAR | Status: DC | PRN
  Administered 2019-02-17 – 2019-02-19 (×5): 2 mg via INTRAVENOUS
  Filled 2019-02-17 (×4): qty 1

## 2019-02-17 NOTE — Progress Notes (Addendum)
[facility] Progress Note Patient Name: Donna Delgado DOB: 11-08-1955 MRN: [id number]   Date of Service  02/17/2019  HPI/Events of Note  Na+ = 162 - Goal Na+ = 155 - 160.   eICU Interventions  Will order: 1. Hold 3% NaCl IV infusion per Neurosurgery.      Intervention Category Major Interventions: Electrolyte abnormality - evaluation and management  Sommer,Steven Eugene 02/17/2019, 9:43 PM

## 2019-02-17 NOTE — Progress Notes (Signed)
Pt was transported to CT scan and back to 7B22 without complications.

## 2019-02-17 NOTE — Progress Notes (Signed)
NAME:  Donna Delgado, MRN:  [id number], DOB:  [birth year], LOS: 7 ADMISSION DATE:  01/29/2019, CONSULTATION DATE:  02/25/2019 REFERRING MD:  Neurosurgery, CHIEF COMPLAINT:  weakness  Brief History   Donna Delgado is a 63 year old woman with no reported PMH, presented on 6/28 with subacute on chronic R arm weakness, found to have a 3.4 cm  frontoparietal enhancing mass with compression and vasogenic edema.  She was taken to OR for tumor exision 7/2 complicated by post op hematoma, taken back for evacuation same day.   History of present illness   Other sx included R foot weakness, falls.  Metastatic work-up with CT chest, abdomen, and pelvis to identify a possible primary malignancy.  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  Started on decadron on 6/29 7/2 OR Tumor resection with 1200 EBL  Post operatively became more obtunded, CTH - hematoma at tumor resection bed.   Repeat crani for evacuation, 300EBL.   Non responsive post operatively, non reactive pupils.  NSG reviewed repeat scan following second surgery --  "significant surrounding edema which I suspect is a combination of cytotoxic and vasogenic edema. I do not see a need for repeat surgical evacuation as the actual hematoma appears to occupy the resection cavity. Will start her on 1cc/kg hypertonic saline and cont dex 10q6"   Past Medical History  No Known hx.  Emphysema on CT chest [place] [facility] Events   Neurosurgery 7/2   Consults:  PCCM 7/2  Procedures:  7/2 brain tumor excision 7/2 hematoma evacuation  Significant Diagnostic Tests:  7/2 CT Head- 1. Regressed intracranial mass effect - including restored basilar cistern patency - following evacuation of postoperative hematoma. 2. Residual gas and blood in the resection cavity and residual rightward midline shift  of 12-14 mm (previously 18 mm). 3. Stable mild asymmetric enlargement of the right lateral ventricle.   7/3 MRI >>Patient has had left frontoparietal craniotomy for tumor resection and subsequently for hematoma evacuation. Residual hematoma on the order of 5.5 cm in diameter. This study does not show any tissue that enhances additionally after today's gadolinium administration. T1 bright tissue in the region of surgery prior to contrast administration is felt to represent blood products.   Mass effect with left-to-right shift of 11 mm, reduced from most recent head CT where it measured 13 mm.   Micro Data:  MRSA surveillance negative SARSCoV2 negative   Antimicrobials:  Cefazolin SCIP 7/2  Interim history/subjective:   Remains critically ill, intubated Afebrile. Good urine output    Review of Systems:   Unable to assess as pt is intubated and unresponsive   Objective   Blood pressure (!) 154/66, pulse 60, temperature 98.1 F (36.7 C), temperature source Axillary, resp. rate 15, height 5\' 3"  (1.6 m), weight 51 kg, SpO2 100 %.    Vent Mode: PRVC FiO2 (%):  [30 %] 30 % Set Rate:  [16 bmp] 16 bmp Vt Set:  [420 mL] 420 mL PEEP:  [5 cmH20] 5 cmH20 Plateau Pressure:  [8 cmH20-13 cmH20] 9 cmH20   Intake/Output Summary (Last 24 hours) at 02/17/2019 1024 Last data filed at 02/17/2019 0900 Gross per 24 hour  Intake 2027.7 ml  Output 1475 ml  Net 552.7 ml   Filed Weights   03/04/2019 0807 02/16/19 0347 02/17/19 0500  Weight: 45.4 kg 50.3 kg 51 kg  Examination: General: well developed, well nourished, acutely ill HENT: L scalp incision site CDI, slight bulge. Pupils 2 mm minimally reactive. MMM Lungs: CTAB. Symmetrical. Vent supported Cardiovascular: RRR no r/m/g Abdomen: + BS x4. SNT/ND Extremities: no edema noted.  Neuro: Comatose, does not follow commands, decorticate posturing on left to deep pain stimulus   Resolved Hospital Problem list   N/A  Assessment &  Plan:  LT frontotemporal brain mass, postop hematoma. Acute encephalopathy-due to edema/shift Decadron 10mg  q 6h-cont per NSGY Decrease hypertonic saline 3% 30 cc/h, Na checks q 6, goal sodium 1 55-1 60 HOB elevated Maintain euglycemia, normothermia Neuro checks Prognosis appears poor for functional recovery  Acute post operative respiratory failure 2/2 above Prn analgesia/sedation/anxiolysis VAP bundle SBT as able  Acute blood loss anemia Consider transfusion for Hgb <7 and/or symptomatic Follow CBC   Intermittent Hypertension Prn anti HTN for now   Hyperglycemia: in setting of steroid administration.  SSI   Smoker:  continue nicotine patch    Summary -postop craniotomy with large hematoma status post evacuation but is left with right hemiplegia and encephalopathy   Best practice:  Diet: NPO. TF per recs today   Pain/Anxiety/Delirium protocol (if indicated): propofol, prn analgesia VAP protocol (if indicated): yes DVT prophylaxis: SCDs. No chemo d/t bleeding GI prophylaxis: PPI Glucose control: SSI Mobility: bed rest  Code Status: Full Family Communication: Per neurosurgery Disposition: ICU  The patient is critically ill with multiple organ systems failure and requires high complexity decision making for assessment and support, frequent evaluation and titration of therapies, application of advanced monitoring technologies and extensive interpretation of multiple databases. Critical Care Time devoted to patient care services described in this note independent of APP/resident  time is 31 minutes.   Kara Mead MD. Shade Flood. [place] [facility] Pager [phone] If no response call [phone]    02/17/19 10:24 AM

## 2019-02-17 NOTE — Progress Notes (Signed)
RN notified Neurosurgery of patient sodium level. MD ok to continue at current rate. RN will continue to monitor.

## 2019-02-17 NOTE — Progress Notes (Signed)
Neurosurgery Service Progress Note  Subjective: No acute events overnight  Objective: Vitals:   02/17/19 0100 02/17/19 0200 02/17/19 0400 02/17/19 0500  BP: (!) 129/53 (!) 155/59    Pulse: (!) 52 (!) 50    Resp: 13 16    Temp:   98.3 F (36.8 C)   TempSrc:   Axillary   SpO2: 100% 100%    Weight:    51 kg  Height:       Temp (24hrs), Avg:98.7 F (37.1 C), Min:98.3 F (36.8 C), Max:99.4 F (37.4 C)  CBC Latest Ref Rng & Units 02/16/2019 02/15/2019 02/19/2019  WBC 4.0 - 10.5 K/uL 14.4(H) 14.4(H) -  Hemoglobin 12.0 - 15.0 g/dL 8.8(L) 8.4(L) 8.8(L)  Hematocrit 36.0 - 46.0 % 28.6(L) 25.6(L) 26.0(L)  Platelets 150 - 400 K/uL 196 218 -   BMP Latest Ref Rng & Units 02/16/2019 02/16/2019 02/16/2019  Glucose 70 - 99 mg/dL - - -  BUN 8 - 23 mg/dL - - -  Creatinine 0.44 - 1.00 mg/dL - - -  Sodium 135 - 145 mmol/L 158(H) 157(H) 158(H)  Potassium 3.5 - 5.1 mmol/L - - -  Chloride 98 - 111 mmol/L - - -  CO2 22 - 32 mmol/L - - -  Calcium 8.9 - 10.3 mg/dL - - -    Intake/Output Summary (Last 24 hours) at 02/17/2019 0605 Last data filed at 02/17/2019 0540 Gross per 24 hour  Intake 2518.31 ml  Output 1600 ml  Net 918.31 ml    Current Facility-Administered Medications:  .  0.9 %  sodium chloride infusion, , Intravenous, Continuous, Yashvi Jasinski, Joyice Faster, MD, Stopped at 02/15/19 1327 .  acetaminophen (TYLENOL) tablet 650 mg, 650 mg, Oral, Q4H PRN **OR** acetaminophen (TYLENOL) suppository 650 mg, 650 mg, Rectal, Q4H PRN, Latavious Bitter A, MD .  chlorhexidine gluconate (MEDLINE KIT) (PERIDEX) 0.12 % solution 15 mL, 15 mL, Mouth Rinse, BID, Deterding, Guadelupe Sabin, MD, 15 mL at 02/16/19 2042 .  Chlorhexidine Gluconate Cloth 2 % PADS 6 each, 6 each, Topical, Daily, Judith Part, MD, 6 each at 02/16/19 [phone] .  dexamethasone (DECADRON) injection 10 mg, 10 mg, Intravenous, Q6H, Costella, Vincent J, PA-C, 10 mg at 02/17/19 0528 .  feeding supplement (VITAL AF 1.2 CAL) liquid 1,000 mL, 1,000 mL, Per  Tube, Continuous, Judith Part, MD, Last Rate: 45 mL/hr at 02/16/19 2041, 1,000 mL at 02/16/19 2041 .  fentaNYL 2568mg in NS 2531m(1062mml) infusion-PREMIX, 0-400 mcg/hr, Intravenous, Continuous, Isabellamarie Randa A, MD .  hydrALAZINE (APRESOLINE) injection 10 mg, 10 mg, Intravenous, Q1H PRN, OstJudith PartD, 10 mg at 02/16/19 0508 .  HYDROmorphone (DILAUDID) injection 0.5 mg, 0.5 mg, Intravenous, Q3H PRN, OstJudith PartD, 0.5 mg at 02/17/19 0528 .  insulin aspart (novoLOG) injection 0-9 Units, 0-9 Units, Subcutaneous, Q4H, Deterding, EliGuadelupe SabinD, 3 Units at 02/17/19 0400 .  labetalol (NORMODYNE) injection 10-40 mg, 10-40 mg, Intravenous, Q10 min PRN, OstJudith PartD, 10 mg at 02/15/19 0207 .  MEDLINE mouth rinse, 15 mL, Mouth Rinse, 10 times per day, Deterding, EliGuadelupe SabinD, 15 mL at 02/17/19 0529 .  nicotine (NICODERM CQ - dosed in mg/24 hours) patch 21 mg, 21 mg, Transdermal, Daily, OstJudith PartD, 21 mg at 02/16/19 0906 .  ondansetron (ZOFRAN) tablet 4 mg, 4 mg, Oral, Q4H PRN **OR** ondansetron (ZOFRAN) injection 4 mg, 4 mg, Intravenous, Q4H PRN, Osmel Dykstra A, MD .  oxyCODONE (Oxy IR/ROXICODONE) immediate release tablet 10 mg, 10  mg, Oral, Q4H PRN, Judith Part, MD .  oxyCODONE (Oxy IR/ROXICODONE) immediate release tablet 5 mg, 5 mg, Oral, Q4H PRN, Judith Part, MD .  pantoprazole (PROTONIX) injection 40 mg, 40 mg, Intravenous, Daily, Deterding, Guadelupe Sabin, MD, 40 mg at 02/16/19 0906 .  promethazine (PHENERGAN) tablet 12.5-25 mg, 12.5-25 mg, Oral, Q4H PRN, [place] A, MD .  propofol (DIPRIVAN) 1000 MG/100ML infusion, 5-80 mcg/kg/min, Intravenous, Titrated, Malee Grays A, MD .  remifentanil (ULTIVA) 5000 mcg in 250 mL normal saline (20 mcg/mL) for ICU use, 0.05 mcg/kg/min, Intravenous, Continuous, Marvette Schamp, Joyice Faster, MD, Stopped at 02/28/2019 1352 .  senna-docusate (Senokot-S) tablet 1 tablet, 1 tablet, Oral, QHS  PRN, Demetrie Borge A, MD .  sodium chloride (hypertonic) 3 % solution, , Intravenous, Continuous, Skylah Delauter A, MD, Last Rate: 30 mL/hr at 02/17/19 0500 .  sodium chloride flush (NS) 0.9 % injection 10-40 mL, 10-40 mL, Intracatheter, Q12H, Dayden Viverette, Joyice Faster, MD, 10 mL at 02/16/19 2130 .  sodium chloride flush (NS) 0.9 % injection 10-40 mL, 10-40 mL, Intracatheter, PRN, Judith Part, MD   Physical Exam: Intubated, no sedation, eyes closed to stim but raising eyebrows and trying to open eyes, PERRL, gaze neutral Not FC or localizing, withdrawing on the left better than the right Incision c/d/i  Assessment and Plan: 63 y.o. 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 7/2 s/p craniotomy for tumor resection, post-op normal recovery from ETGA then progressive loss of exam, CTH w/ large post-op hematoma s/p emergent clot evac. 7/3 post-op CTH improved, 7/4 MRI with GTR, improved midline shift  -repeat CTH today to monitor intracranial findings -continue dex 10q6  Judith Part  02/17/19 6:05 AM

## 2019-02-18 ENCOUNTER — Inpatient Hospital Stay (HOSPITAL_COMMUNITY): Payer: BC Managed Care – PPO

## 2019-02-18 ENCOUNTER — Encounter (HOSPITAL_COMMUNITY): Payer: Self-pay | Admitting: Neurological Surgery

## 2019-02-18 DIAGNOSIS — G9340 Encephalopathy, unspecified: Secondary | ICD-10-CM | POA: Diagnosis not present

## 2019-02-18 DIAGNOSIS — J9600 Acute respiratory failure, unspecified whether with hypoxia or hypercapnia: Secondary | ICD-10-CM | POA: Diagnosis not present

## 2019-02-18 DIAGNOSIS — G9389 Other specified disorders of brain: Secondary | ICD-10-CM | POA: Diagnosis not present

## 2019-02-18 DIAGNOSIS — D496 Neoplasm of unspecified behavior of brain: Secondary | ICD-10-CM

## 2019-02-18 LAB — PHOSPHORUS: Phosphorus: 3.6 mg/dL (ref 2.5–4.6)

## 2019-02-18 LAB — CBC
HCT: 23.8 % — ABNORMAL LOW (ref 36.0–46.0)
Hemoglobin: 7.3 g/dL — ABNORMAL LOW (ref 12.0–15.0)
MCH: 33 pg (ref 26.0–34.0)
MCHC: 30.7 g/dL (ref 30.0–36.0)
MCV: 107.7 fL — ABNORMAL HIGH (ref 80.0–100.0)
Platelets: 207 10*3/uL (ref 150–400)
RBC: 2.21 MIL/uL — ABNORMAL LOW (ref 3.87–5.11)
RDW: 15.5 % (ref 11.5–15.5)
WBC: 10.1 10*3/uL (ref 4.0–10.5)
nRBC: 0 % (ref 0.0–0.2)

## 2019-02-18 LAB — GLUCOSE, CAPILLARY
Glucose-Capillary: 131 mg/dL — ABNORMAL HIGH (ref 70–99)
Glucose-Capillary: 135 mg/dL — ABNORMAL HIGH (ref 70–99)
Glucose-Capillary: 141 mg/dL — ABNORMAL HIGH (ref 70–99)
Glucose-Capillary: 146 mg/dL — ABNORMAL HIGH (ref 70–99)
Glucose-Capillary: 156 mg/dL — ABNORMAL HIGH (ref 70–99)
Glucose-Capillary: 202 mg/dL — ABNORMAL HIGH (ref 70–99)
Glucose-Capillary: 98 mg/dL (ref 70–99)

## 2019-02-18 LAB — BASIC METABOLIC PANEL
Anion gap: 3 — ABNORMAL LOW (ref 5–15)
BUN: 23 mg/dL (ref 8–23)
CO2: 27 mmol/L (ref 22–32)
Calcium: 8.2 mg/dL — ABNORMAL LOW (ref 8.9–10.3)
Chloride: 129 mmol/L — ABNORMAL HIGH (ref 98–111)
Creatinine, Ser: 0.53 mg/dL (ref 0.44–1.00)
GFR calc Af Amer: >60 mL/min (ref >60)
GFR calc non Af Amer: >60 mL/min (ref >60)
Glucose, Bld: 150 mg/dL — ABNORMAL HIGH (ref 70–99)
Potassium: 3.3 mmol/L — ABNORMAL LOW (ref 3.5–5.1)
Sodium: 159 mmol/L — ABNORMAL HIGH (ref 135–145)

## 2019-02-18 LAB — SODIUM
Sodium: 162 mmol/L (ref 135–145)
Sodium: 157 mmol/L — ABNORMAL HIGH (ref 135–145)
Sodium: 154 mmol/L — ABNORMAL HIGH (ref 135–145)

## 2019-02-18 LAB — AMMONIA: Ammonia: 19 umol/L (ref 9–35)

## 2019-02-18 LAB — MAGNESIUM: Magnesium: 2.5 mg/dL — ABNORMAL HIGH (ref 1.7–2.4)

## 2019-02-18 MED ORDER — CHLORHEXIDINE GLUCONATE CLOTH 2 % EX PADS
6.0000 | MEDICATED_PAD | Freq: Every day | CUTANEOUS | Status: DC
  Administered 2019-02-18 – 2019-02-24 (×6): 6 via TOPICAL

## 2019-02-18 MED ORDER — MIDAZOLAM HCL 2 MG/2ML IJ SOLN
2.0000 mg | Freq: Once | INTRAMUSCULAR | Status: AC
  Administered 2019-02-18: 2 mg via INTRAVENOUS

## 2019-02-18 MED ORDER — CHLORHEXIDINE GLUCONATE 0.12 % MT SOLN
OROMUCOSAL | Status: AC
  Filled 2019-02-18: qty 15

## 2019-02-18 MED ORDER — MIDAZOLAM HCL 2 MG/2ML IJ SOLN
INTRAMUSCULAR | Status: AC
  Filled 2019-02-18: qty 2

## 2019-02-18 MED ORDER — PANTOPRAZOLE SODIUM 40 MG PO PACK
40.0000 mg | PACK | Freq: Every day | ORAL | Status: DC
  Administered 2019-02-19 – 2019-02-24 (×6): 40 mg
  Filled 2019-02-18 (×6): qty 20

## 2019-02-18 MED ORDER — ROCURONIUM BROMIDE 50 MG/5ML IV SOLN
50.0000 mg | Freq: Once | INTRAVENOUS | Status: AC
  Administered 2019-02-18: 50 mg via INTRAVENOUS
  Filled 2019-02-18 (×2): qty 5

## 2019-02-18 MED ORDER — HEPARIN SODIUM (PORCINE) 5000 UNIT/ML IJ SOLN
5000.0000 [IU] | Freq: Three times a day (TID) | INTRAMUSCULAR | Status: DC
  Administered 2019-02-18 – 2019-02-24 (×16): 5000 [IU] via SUBCUTANEOUS
  Filled 2019-02-18 (×16): qty 1

## 2019-02-18 MED ORDER — FENTANYL CITRATE (PF) 100 MCG/2ML IJ SOLN
25.0000 ug | INTRAMUSCULAR | Status: DC | PRN
  Administered 2019-02-18 (×2): 100 ug via INTRAVENOUS
  Administered 2019-02-18 – 2019-02-19 (×2): 50 ug via INTRAVENOUS
  Administered 2019-02-19 – 2019-02-20 (×3): 100 ug via INTRAVENOUS
  Administered 2019-02-20 – 2019-02-21 (×2): 50 ug via INTRAVENOUS
  Administered 2019-02-21: 100 ug via INTRAVENOUS
  Filled 2019-02-18 (×10): qty 2

## 2019-02-18 NOTE — Progress Notes (Signed)
NAME:  Donna Delgado, MRN:  [id number], DOB:  [birth year], LOS: 8 ADMISSION DATE:  01/19/2019, CONSULTATION DATE:  02/16/2019 REFERRING MD:  Neurosurgery, CHIEF COMPLAINT:  weakness  Brief History   Donna Delgado is a 63 year old woman with no reported PMH, presented on 6/28 with subacute on chronic R arm weakness, found to have a 3.4 cm  frontoparietal enhancing mass with compression and vasogenic edema.  She was taken to OR for tumor exision 7/2 complicated by post op hematoma, taken back for evacuation same day.   History of present illness   Other sx included R foot weakness, falls.  Metastatic work-up with CT chest, abdomen, and pelvis to identify a possible primary malignancy.  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  Started on decadron on 6/29 7/2 OR Tumor resection with 1200 EBL  Post operatively became more obtunded, CTH - hematoma at tumor resection bed.   Repeat crani for evacuation, 300EBL.   Non responsive post operatively, non reactive pupils.  NSG reviewed repeat scan following second surgery --  "significant surrounding edema which I suspect is a combination of cytotoxic and vasogenic edema. I do not see a need for repeat surgical evacuation as the actual hematoma appears to occupy the resection cavity. Will start her on 1cc/kg hypertonic saline and cont dex 10q6"   Past Medical History  No Known hx.  Emphysema on CT chest [facility] Events   Neurosurgery 7/2   Consults:  PCCM 7/2  Procedures:  7/2 brain tumor excision 7/2 hematoma evacuation  Significant Diagnostic Tests:  7/2 CT Head- 1. Regressed intracranial mass effect - including restored basilar cistern patency - following evacuation of postoperative hematoma. 2. Residual gas and blood in the resection cavity and residual rightward midline shift  of 12-14 mm (previously 18 mm). 3. Stable mild asymmetric enlargement of the right lateral ventricle.   7/3 MRI >>Patient has had left frontoparietal craniotomy for tumor resection and subsequently for hematoma evacuation. Residual hematoma on the order of 5.5 cm in diameter. This study does not show any tissue that enhances additionally after today's gadolinium administration. T1 bright tissue in the region of surgery prior to contrast administration is felt to represent blood products.   Mass effect with left-to-right shift of 11 mm, reduced from most recent head CT where it measured 13 mm.   Micro Data:  MRSA surveillance negative SARSCoV2 negative   Antimicrobials:  Cefazolin SCIP 7/2  Interim history/subjective:   Afebrile Critically ill, intubated, remains comatose  Adequate urine output  Objective   Blood pressure (!) 143/75, pulse 63, temperature (!) 96.8 F (36 C), temperature source Axillary, resp. rate 12, height 5\' 3"  (1.6 m), weight 53.1 kg, SpO2 100 %.    Vent Mode: PSV;CPAP FiO2 (%):  [30 %] 30 % Set Rate:  [16 bmp] 16 bmp Vt Set:  [410 mL-420 mL] 410 mL PEEP:  [5 cmH20] 5 cmH20 Pressure Support:  [10 cmH20] 10 cmH20 Plateau Pressure:  [9 cmH20-18 cmH20] 11 cmH20   Intake/Output Summary (Last 24 hours) at 02/18/2019 0930 Last data filed at 02/18/2019 0700 Gross per 24 hour  Intake 1530.52 ml  Output 1200 ml  Net 330.52 ml   Filed Weights   02/16/19 0347 02/17/19 0500 02/18/19 0500  Weight: 50.3 kg 51 kg 53.1 kg    Examination: General: well developed, well nourished, acutely  ill HENT: L scalp incision site CDI, slight bulge. Pupils 2 mm minimally reactive. MMM Lungs: CTAB. Symmetrical. Vent supported Cardiovascular: RRR no r/m/g Abdomen: + BS x4. SNT/ND Extremities: no edema noted.  Neuro: Comatose, does not follow commands, decorticate posturing on left to deep pain stimulus, withdraws both feet to pain   Resolved Hospital Problem list    N/A  Assessment & Plan:  LT frontotemporal brain mass, postop hematoma. Acute encephalopathy-due to edema/shift Decadron 10mg  q 6h-cont per NSGY Hypertonic saline on hold currently,Na checks q 6, goal sodium 1 55-1 60 HOB elevated Maintain euglycemia, normothermia Neuro checks Obtain EEG today Does not seem to require continue sedation, use fentanyl as needed with goal RA SS 0 Prognosis appears poor for functional recovery  Acute post operative respiratory failure 2/2 above Prn analgesia/sedation/anxiolysis VAP bundle Tolerates pressure support 10/5  Acute blood loss anemia -globin continues to drop Consider transfusion for Hgb <7 and/or symptomatic Follow CBC   Intermittent Hypertension Prn anti HTN for now   Hyperglycemia: Due to dexamethasone SSI   Smoker:  continue nicotine patch    Summary -postop craniotomy with large hematoma status post evacuation but is left with right hemiplegia and encephalopathy   Best practice:  Diet: NPO. TF per recs today   Pain/Anxiety/Delirium protocol (if indicated):  prn analgesia VAP protocol (if indicated): yes DVT prophylaxis: SCDs. No lovenox d/t bleeding GI prophylaxis: PPI Glucose control: SSI Mobility: bed rest  Code Status: Full Family Communication: Per neurosurgery Disposition: ICU  The patient is critically ill with multiple organ systems failure and requires high complexity decision making for assessment and support, frequent evaluation and titration of therapies, application of advanced monitoring technologies and extensive interpretation of multiple databases. Critical Care Time devoted to patient care services described in this note independent of APP/resident  time is 31 minutes.    Kara Mead MD. Shade Flood. [facility] Pager [phone] If no response call [phone]    02/18/19 9:30 AM

## 2019-02-18 NOTE — Progress Notes (Signed)
RT note: patient placed on CPAP/PSV of 10/5 at 0755.  Currently tolerating well.  Will continue to monitor.

## 2019-02-18 NOTE — Progress Notes (Signed)
vLTM EEG running. No skin breakdown/ notified Neuro

## 2019-02-18 NOTE — Consult Note (Signed)
                    NEURO HOSPITALIST CONSULT NOTE   Requestig physician: Dr. Alva   Reason for Consult:Seizure activity   History obtained from:  Chart    HPI:                                                                                                                                          Donna Delgado is an 63 y.o. female with no reported medical history who presented on 6/28 with a 3 week history of acute on chronic right arm weakness.  Head CT showed a 3.4 cm frontoparietal enhancing mass with compression and vasogenic edema. CT chest/abd/pelvis showed two 4 mm R pulmonary nodules and hepatic angioma, otherwise unremarkable. She underwent tumor excision on 7/2 that was complicated by postop hematoma and returned for evacuation on the same day. Following the surgery she was noted to be non-responsive with non-reactive pupils.  An MRI on 7/3 showed left frontoparietal craniotomy, residual hematoma on order 5.5 cm in diameter, mass-effect with left-to-right shift of 11 mm, reduced from CT that showed 13 mm. NSG reviewed scan, started on 1 cc/kg hypertonic saline and decadron 10 mg q6 hours.   Overnight nursing reported some activity concerning for seizures. Started on Keppra 1000 mg q12 hours and LTM EEG was ordered. Nurse reports that patient has not had any evidence of seizure on exam, no jerking movements noted.  On LTM EEG now.    History reviewed. No pertinent past medical history.  History reviewed. No pertinent surgical history.  History reviewed. No pertinent family history.             Social History:  reports that she has been smoking. She has been smoking about 2.00 packs per day. She has never used smokeless tobacco. She reports current alcohol use of about 3.0 - 4.0 standard drinks of alcohol per week. No history on file for drug.  No Known Allergies  MEDICATIONS:                                                                                                                      Scheduled: . chlorhexidine gluconate (MEDLINE KIT)  15 mL Mouth Rinse BID  . Chlorhexidine Gluconate Cloth  6 each Topical Daily  . Chlorhexidine Gluconate Cloth  6 each Topical   Daily  . dexamethasone  10 mg Intravenous Q6H  . insulin aspart  0-9 Units Subcutaneous Q4H  . mouth rinse  15 mL Mouth Rinse 10 times per day  . midazolam  2 mg Intravenous Once  . nicotine  21 mg Transdermal Daily  . [START ON 02/19/2019] pantoprazole sodium  40 mg Per Tube Daily  . sodium chloride flush  10-40 mL Intracatheter Q12H   Continuous: . sodium chloride 10 mL/hr at 02/18/19 0700  . feeding supplement (VITAL AF 1.2 CAL) 1,000 mL (02/17/19 1933)  . levETIRAcetam Stopped (02/18/19 0400)  . sodium chloride (hypertonic)     LTJ:QZESPQZRAQTMA **OR** acetaminophen, fentaNYL (SUBLIMAZE) injection, hydrALAZINE, HYDROmorphone (DILAUDID) injection, labetalol, LORazepam, ondansetron **OR** ondansetron (ZOFRAN) IV, oxyCODONE, oxyCODONE, promethazine, senna-docusate, sodium chloride flush   ROS:                                                                                                                                       History obtained from chart review and unobtainable from patient due to mental status  General ROS: negative for - chills, fatigue, fever, night sweats, weight gain or weight loss Psychological ROS: negative for - behavioral disorder, hallucinations, memory difficulties, mood swings or suicidal ideation Ophthalmic ROS: negative for - blurry vision, double vision, eye pain or loss of vision ENT ROS: negative for - epistaxis, nasal discharge, oral lesions, sore throat, tinnitus or vertigo Allergy and Immunology ROS: negative for - hives or itchy/watery eyes Hematological and Lymphatic ROS: negative for - bleeding problems, bruising or swollen lymph nodes Endocrine ROS: negative for - galactorrhea, hair pattern changes, polydipsia/polyuria or temperature  intolerance Respiratory ROS: negative for - cough, hemoptysis, shortness of breath or wheezing Cardiovascular ROS: negative for - chest pain, dyspnea on exertion, edema or irregular heartbeat Gastrointestinal ROS: negative for - abdominal pain, diarrhea, hematemesis, nausea/vomiting or stool incontinence Genito-Urinary ROS: negative for - dysuria, hematuria, incontinence or urinary frequency/urgency Musculoskeletal ROS: negative for - joint swelling or muscular weakness Neurological ROS: as noted in HPI Dermatological ROS: negative for rash and skin lesion changes   Blood pressure 134/74, pulse 60, temperature (!) 96.5 F (35.8 C), temperature source Axillary, resp. rate 11, height 5' 3" (1.6 m), weight 53.1 kg, SpO2 100 %.   General Examination:                                                                                                       Physical Exam  HEENT-  Normocephalic, no  lesions, without obvious abnormality.  Cardiovascular- S1-S2 audible, pulses palpable throughout   Lungs-no rhonchi or wheezing noted, no excessive working breathing.  Saturations within normal limits  Neurological Examination Mental Status: Not alert, intubated, not on sedation. Not following commands.  Cranial Nerves: II: No blink to threat, not able to assess visual fields III,IV, VI: Pupils asymmetric and minimally reactive to light, left ward eye deviation V,VII: No obvious facial asymmetry noted VIII, IX,X, XI, XII: unable to assess, intubated and not following commands Motor: Right and left : Upper extremity: rigid, extended, no movement with stimulation Right and left lower extremity: rigid, extended , left leg withdrawals to painful stimuli Sensory: Upper extremity posturing to painful stimulation, some withdrawal in lower extremities in response to painful stimuli Deep Tendon Reflexes: 3+ reflexes throughout in upper and lower extremities Plantars: Right: downgoing   Left:  downgoing Cerebellar, gait: unable to assess   Lab Results: Basic Metabolic Panel: Recent Labs  Lab 02/13/19 0440 02/16/2019 0432  03/01/2019 1751 03/01/2019 2341  02/15/19 0413  02/15/19 1700  02/16/19 0549 02/16/19 0550  02/16/19 1811 02/16/19 2358 02/17/19 0534 02/17/19 1828 02/18/19 0058 02/18/19 0551  NA 141 142   < > 140 146*   < > 148*   < >  --    < > 155*  --    < > 157* 158* 159* 162* 162* 159*  K 4.8 3.8   < > 4.1 3.7  --  3.7  --   --   --  3.4*  --   --   --   --   --   --   --  3.3*  CL 107 108  --   --   --   --  120*  --   --   --  127*  --   --   --   --   --   --   --  129*  CO2 26 26  --   --   --   --  21*  --   --   --  23  --   --   --   --   --   --   --  27  GLUCOSE 140* 134*  --  155*  --   --  121*  --   --   --  151*  --   --   --   --   --   --   --  150*  BUN 14 15  --   --   --   --  11  --   --   --  11  --   --   --   --   --   --   --  23  CREATININE 0.72 0.71  --   --   --   --  0.81  --   --   --  0.65  --   --   --   --   --   --   --  0.53  CALCIUM 9.1 8.7*  --   --   --   --  7.4*  --   --   --  8.6*  --   --   --   --   --   --   --  8.2*  MG  --   --   --   --   --   --   --   --  2.3  --   --  2.5*  --  2.7*  --  2.5*  --   --  2.5*  PHOS  --   --   --   --   --   --   --   --  2.8  --   --  1.7*  --  2.7  --  2.5  --   --  3.6   < > = values in this interval not displayed.    CBC: Recent Labs  Lab 02/13/19 0440 02/13/2019 0432  03/01/2019 1751 02/18/2019 2341 02/15/19 0413 02/16/19 0549 02/18/19 0551  WBC 17.0* 14.7*  --   --   --  14.4* 14.4* 10.1  HGB 13.6 12.9   < > 8.5* 8.8* 8.4* 8.8* 7.3*  HCT 40.7 39.3   < > 25.0* 26.0* 25.6* 28.6* 23.8*  MCV 98.8 100.0  --   --   --  101.6* 106.7* 107.7*  PLT 351 318  --   --   --  218 196 207   < > = values in this interval not displayed.    Cardiac Enzymes: No results for input(s): CKTOTAL, CKMB, CKMBINDEX, TROPONINI in the last 168 hours.  Lipid Panel: Recent Labs  Lab 02/27/2019 1851   TRIG 48    Imaging: Ct Head Wo Contrast  Result Date: 02/17/2019 CLINICAL DATA:  Followup intracranial hemorrhage EXAM: CT HEAD WITHOUT CONTRAST TECHNIQUE: Contiguous axial images were obtained from the base of the skull through the vertex without intravenous contrast. COMPARISON:  02/26/2019 FINDINGS: Brain: No additional bleeding. Previous left frontoparietal craniotomy for tumor resection and subsequent hematoma evacuation. No evidence of additional/new blood products in the operative bed. Surrounding edema is slightly diminished. Mass effect with left-to-right right shift is reduced, 11 mm today compared with 13 mm previously. No ventricular trapping. No extra-axial collection. No new or worsening findings. Vascular: No primary vascular finding. Skull: Craniotomy changes on the left as seen previously. Sinuses/Orbits: Clear/normal Other: None IMPRESSION: Expected evolutionary changes of hemorrhage in the operative bed in the left frontoparietal region, with diminishing mass effect and edema, 11 mm left-to-right shift today compared with 13 mm on 02/25/2019. No worsening findings. Electronically Signed   By: Mark  Shogry M.D.   On: 02/17/2019 14:21   Dg Chest Port 1 View  Result Date: 02/18/2019 CLINICAL DATA:  Acute respiratory failure. EXAM: PORTABLE CHEST 1 VIEW COMPARISON:  CT 01/29/2019. FINDINGS: Endotracheal tube noted with tip 3 cm above the carina. Right PICC line noted with tip at cavoatrial junction. Mediastinum hilar structures normal. Heart size normal. Lungs are clear. No pleural effusion. Mild elevation left hemidiaphragm. No acute bony abnormality. IMPRESSION: 1. Endotracheal tube noted with tip 3 cm above the carina. Right PICC line noted with tip at cavoatrial junction. 2.  No acute pulmonary disease.  Mild elevation left hemidiaphragm. Electronically Signed   By: Thomas  Register   On: 02/18/2019 06:28    Assessment Donna Delgado is an 62 y.o. female with no reported medical  history who presented on 6/28 with subacute on chronic right arm weakness, noted to have 3.4 cm frontoparietal enhancing mass with compression and vasogenic edema. S/p tumor excision complicated by post op hematoma. Since the surgery she has had no responsiveness, not alert or following commands. On exam today she has some bilateral posturing noted to painful stimuli, some left ward gaze preference, diffuse hyperreflexia throughout. No evidence of seizure like activity on exam however possible concern on EEG. Loaded   with Keppra. Patient is noted to have significant electrolyte abnormalities including Na 159, K 3.3. EEG does not appear to have evidence of any seizure activity at this time.  Impression: Questionable seizure like activity, if evidence of seizures noted on EEG then it could be related to her tumor resection complicated by post op hematoma vs metabolic causes.  Concern for possible brainstem injury given findings on exam  Recommendations: 1) Continue LTM for now  2) Continue keppra 1000 mg BID 3) Continue correcting electrolytes per primary 4) Will need to obtain MRI after LTM is discontinued, likely tomorrow 5) Will continue to follow  Marissa M Krienke, M.D. PGY2 02/18/2019 12:19 PM   

## 2019-02-18 NOTE — Progress Notes (Signed)
RT note: patient placed back on full support ventilation due to decreased respiratory rate.  Also have attempted to place bite block however patient still clamped and unable to get one placed.  Will continue to monitor.

## 2019-02-18 NOTE — Progress Notes (Signed)
Inpatient Rehabilitation Admissions Coordinator  I will sign off at this time. Please re consult when appropriate.  Danne Baxter, RN, MSN Rehab Admissions Coordinator [phone] 02/18/2019 3:20 PM

## 2019-02-18 NOTE — Progress Notes (Signed)
Occupational Therapy Discharge Patient Details Name: SAAYA PROCELL MRN: [id number] DOB: [birth year] Today's Date: 02/18/2019 Time:  -     Patient discharged from OT services secondary to medical decline - will need to re-order OT to resume therapy services.  Please see latest therapy progress note for current level of functioning and progress toward goals.    Progress and discharge plan discussed with patient and/or caregiver: Patient unable to participate in discharge planning and no caregivers available  GO     Jeri Modena 02/18/2019, 3:33 PM

## 2019-02-18 NOTE — Progress Notes (Signed)
RT note: bite block placed after given paralytic by RN.

## 2019-02-18 NOTE — Progress Notes (Signed)
Neurosurgery Service Progress Note  Subjective: No acute events overnight, nursing noted some activity concerning for seizures, keppra started  Objective: Vitals:   02/18/19 0400 02/18/19 0500 02/18/19 0600 02/18/19 0700  BP: 137/75 119/71 117/69 136/65  Pulse: 62 (!) 58 (!) 59 (!) 57  Resp: _0 Temp: (!) 96.8 F (36 C)     TempSrc: Axillary     SpO2: 100% 100% 100% 100%  Weight:  53.1 kg    Height:       Temp (24hrs), Avg:97.8 F (36.6 C), Min:96.8 F (36 C), Max:98.8 F (37.1 C)  CBC Latest Ref Rng & Units 02/18/2019 02/16/2019 02/15/2019  WBC 4.0 - 10.5 K/uL 10.1 14.4(H) 14.4(H)  Hemoglobin 12.0 - 15.0 g/dL 7.3(L) 8.8(L) 8.4(L)  Hematocrit 36.0 - 46.0 % 23.8(L) 28.6(L) 25.6(L)  Platelets 150 - 400 K/uL 207 196 218   BMP Latest Ref Rng & Units 02/18/2019 02/18/2019 02/17/2019  Glucose 70 - 99 mg/dL 150(H) - -  BUN 8 - 23 mg/dL 23 - -  Creatinine 0.44 - 1.00 mg/dL 0.53 - -  Sodium 135 - 145 mmol/L 159(H) 162(HH) 162(HH)  Potassium 3.5 - 5.1 mmol/L 3.3(L) - -  Chloride 98 - 111 mmol/L 129(H) - -  CO2 22 - 32 mmol/L 27 - -  Calcium 8.9 - 10.3 mg/dL 8.2(L) - -    Intake/Output Summary (Last 24 hours) at 02/18/2019 0821 Last data filed at 02/18/2019 0700 Gross per 24 hour  Intake 1590.62 ml  Output 1200 ml  Net 390.62 ml    Current Facility-Administered Medications:  .  0.9 %  sodium chloride infusion, , Intravenous, Continuous, Xzavion Doswell, Joyice Faster, MD, Last Rate: 10 mL/hr at 02/18/19 0700 .  acetaminophen (TYLENOL) tablet 650 mg, 650 mg, Oral, Q4H PRN **OR** acetaminophen (TYLENOL) suppository 650 mg, 650 mg, Rectal, Q4H PRN, Malasia Torain A, MD .  chlorhexidine gluconate (MEDLINE KIT) (PERIDEX) 0.12 % solution 15 mL, 15 mL, Mouth Rinse, BID, Deterding, Guadelupe Sabin, MD, 15 mL at 02/18/19 0756 .  Chlorhexidine Gluconate Cloth 2 % PADS 6 each, 6 each, Topical, Daily, Judith Part, MD, 6 each at 02/17/19 0900 .  Chlorhexidine Gluconate Cloth 2 % PADS 6 each, 6  each, Topical, Daily, Lorita Forinash A, MD .  dexamethasone (DECADRON) injection 10 mg, 10 mg, Intravenous, Q6H, Costella, Vincent J, PA-C, 10 mg at 02/18/19 0550 .  feeding supplement (VITAL AF 1.2 CAL) liquid 1,000 mL, 1,000 mL, Per Tube, Continuous, Alena Blankenbeckler, Joyice Faster, MD, Last Rate: 45 mL/hr at 02/17/19 1933, 1,000 mL at 02/17/19 1933 .  fentaNYL 2525mg in NS 2515m(1079mml) infusion-PREMIX, 0-400 mcg/hr, Intravenous, Continuous, Emmogene Simson A, MD .  hydrALAZINE (APRESOLINE) injection 10 mg, 10 mg, Intravenous, Q1H PRN, OstJudith PartD, 10 mg at 02/16/19 0508 .  HYDROmorphone (DILAUDID) injection 0.5 mg, 0.5 mg, Intravenous, Q3H PRN, OstJudith PartD, 0.5 mg at 02/18/19 0344 .  insulin aspart (novoLOG) injection 0-9 Units, 0-9 Units, Subcutaneous, Q4H, Deterding, EliGuadelupe SabinD, 1 Units at 02/18/19 0550 .  labetalol (NORMODYNE) injection 10-40 mg, 10-40 mg, Intravenous, Q10 min PRN, OstJudith PartD, 10 mg at 02/15/19 0207 .  levETIRAcetam (KEPPRA) IVPB 1000 mg/100 mL premix, 1,000 mg, Intravenous, Q12H, Carmelita Amparo, ThoJoyice FasterD, Stopped at 02/18/19 0400 .  LORazepam (ATIVAN) injection 2 mg, 2 mg, Intravenous, Q2H PRN, OstJudith PartD, 2 mg at 02/17/19 1927 .  MEDLINE mouth rinse, 15 mL, Mouth Rinse, 10 times per day, Deterding,  Elizabeth C, MD, 15 mL at 02/18/19 0550 .  nicotine (NICODERM CQ - dosed in mg/24 hours) patch 21 mg, 21 mg, Transdermal, Daily, Ostergard, Thomas A, MD, 21 mg at 02/17/19 0902 .  ondansetron (ZOFRAN) tablet 4 mg, 4 mg, Oral, Q4H PRN **OR** ondansetron (ZOFRAN) injection 4 mg, 4 mg, Intravenous, Q4H PRN, Ostergard, Thomas A, MD .  oxyCODONE (Oxy IR/ROXICODONE) immediate release tablet 10 mg, 10 mg, Oral, Q4H PRN, Ostergard, Thomas A, MD .  oxyCODONE (Oxy IR/ROXICODONE) immediate release tablet 5 mg, 5 mg, Oral, Q4H PRN, Ostergard, Thomas A, MD .  pantoprazole (PROTONIX) injection 40 mg, 40 mg, Intravenous, Daily, Deterding,  Elizabeth C, MD, 40 mg at 02/17/19 0900 .  promethazine (PHENERGAN) tablet 12.5-25 mg, 12.5-25 mg, Oral, Q4H PRN, Ostergard, Thomas A, MD .  propofol (DIPRIVAN) 1000 MG/100ML infusion, 5-80 mcg/kg/min, Intravenous, Titrated, Ostergard, Thomas A, MD .  senna-docusate (Senokot-S) tablet 1 tablet, 1 tablet, Oral, QHS PRN, Ostergard, Thomas A, MD .  sodium chloride (hypertonic) 3 % solution, , Intravenous, Continuous, Sommer, Steven E, MD .  sodium chloride flush (NS) 0.9 % injection 10-40 mL, 10-40 mL, Intracatheter, Q12H, Ostergard, Thomas A, MD, 10 mL at 02/17/19 2200 .  sodium chloride flush (NS) 0.9 % injection 10-40 mL, 10-40 mL, Intracatheter, PRN, Ostergard, Thomas A, MD   Physical Exam: Intubated, no sedation, eyes closed to stim, PERRL, increased tone on R, w/d on left, not FC Incision c/d/i  Assessment and Plan: 63 y.o. 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 7/2 s/p craniotomy for tumor resection, post-op normal recovery from ETGA then progressive loss of exam, CTH w/ large post-op hematoma s/p emergent clot evac. 7/3 post-op CTH improved, 7/4 MRI with GTR, improved midline shift, 7/5 rpt CTH with continued decrease in midline shift  -EEG today to evaluate for underlying seizure activity. When I rounded this morning, she had some left hemispheric slowing that appeared to evolve into some high frequency activity, asked for review of EEG to see if this is ongoing Sz activity -will give a few more days to see how much she recovers neurologically, make sure that seizures are not clouding her neurologic exam -will start weaning dex tomorrow -SCDs/TEDs/SQH  Thomas A Ostergard  02/18/19 8:21 AM  

## 2019-02-19 DIAGNOSIS — J9600 Acute respiratory failure, unspecified whether with hypoxia or hypercapnia: Secondary | ICD-10-CM | POA: Diagnosis not present

## 2019-02-19 DIAGNOSIS — G9389 Other specified disorders of brain: Secondary | ICD-10-CM | POA: Diagnosis not present

## 2019-02-19 DIAGNOSIS — G9340 Encephalopathy, unspecified: Secondary | ICD-10-CM | POA: Diagnosis not present

## 2019-02-19 LAB — BASIC METABOLIC PANEL
Anion gap: 11 (ref 5–15)
BUN: 25 mg/dL — ABNORMAL HIGH (ref 8–23)
CO2: 22 mmol/L (ref 22–32)
Calcium: 7.4 mg/dL — ABNORMAL LOW (ref 8.9–10.3)
Chloride: 121 mmol/L — ABNORMAL HIGH (ref 98–111)
Creatinine, Ser: 0.54 mg/dL (ref 0.44–1.00)
GFR calc Af Amer: >60 mL/min (ref >60)
GFR calc non Af Amer: >60 mL/min (ref >60)
Glucose, Bld: 125 mg/dL — ABNORMAL HIGH (ref 70–99)
Potassium: 3.1 mmol/L — ABNORMAL LOW (ref 3.5–5.1)
Sodium: 154 mmol/L — ABNORMAL HIGH (ref 135–145)

## 2019-02-19 LAB — GLUCOSE, CAPILLARY
Glucose-Capillary: 148 mg/dL — ABNORMAL HIGH (ref 70–99)
Glucose-Capillary: 144 mg/dL — ABNORMAL HIGH (ref 70–99)
Glucose-Capillary: 136 mg/dL — ABNORMAL HIGH (ref 70–99)
Glucose-Capillary: 136 mg/dL — ABNORMAL HIGH (ref 70–99)
Glucose-Capillary: 133 mg/dL — ABNORMAL HIGH (ref 70–99)
Glucose-Capillary: 140 mg/dL — ABNORMAL HIGH (ref 70–99)

## 2019-02-19 LAB — CBC
HCT: 24.6 % — ABNORMAL LOW (ref 36.0–46.0)
Hemoglobin: 7.7 g/dL — ABNORMAL LOW (ref 12.0–15.0)
MCH: 33.2 pg (ref 26.0–34.0)
MCHC: 31.3 g/dL (ref 30.0–36.0)
MCV: 106 fL — ABNORMAL HIGH (ref 80.0–100.0)
Platelets: 235 10*3/uL (ref 150–400)
RBC: 2.32 MIL/uL — ABNORMAL LOW (ref 3.87–5.11)
RDW: 15.6 % — ABNORMAL HIGH (ref 11.5–15.5)
WBC: 12.8 10*3/uL — ABNORMAL HIGH (ref 4.0–10.5)
nRBC: 0 % (ref 0.0–0.2)

## 2019-02-19 LAB — MAGNESIUM: Magnesium: 2.2 mg/dL (ref 1.7–2.4)

## 2019-02-19 LAB — PHOSPHORUS: Phosphorus: 3.3 mg/dL (ref 2.5–4.6)

## 2019-02-19 LAB — SODIUM
Sodium: 155 mmol/L — ABNORMAL HIGH (ref 135–145)
Sodium: 158 mmol/L — ABNORMAL HIGH (ref 135–145)
Sodium: 158 mmol/L — ABNORMAL HIGH (ref 135–145)

## 2019-02-19 MED ORDER — POTASSIUM CHLORIDE 20 MEQ/15ML (10%) PO SOLN
40.0000 meq | Freq: Once | ORAL | Status: AC
  Administered 2019-02-19: 40 meq via ORAL
  Filled 2019-02-19: qty 30

## 2019-02-19 MED ORDER — DEXAMETHASONE SODIUM PHOSPHATE 10 MG/ML IJ SOLN
4.0000 mg | Freq: Two times a day (BID) | INTRAMUSCULAR | Status: DC
  Administered 2019-02-19: 4 mg via INTRAVENOUS
  Filled 2019-02-19: qty 1

## 2019-02-19 NOTE — Progress Notes (Signed)
Neurosurgery Service Progress Note  Subjective: No acute events overnight  Objective: Vitals:   02/19/19 1000 02/19/19 1100 02/19/19 1149 02/19/19 1200  BP: (!) 167/84 102/64 102/64   Pulse: 100 80 68   Resp: (!) '21 16 16   ' Temp:    98.9 F (37.2 C)  TempSrc:    Axillary  SpO2: 100% 100% 100%   Weight:      Height:       Temp (24hrs), Avg:98.5 F (36.9 C), Min:97.3 F (36.3 C), Max:99.2 F (37.3 C)  CBC Latest Ref Rng & Units 02/19/2019 02/18/2019 02/16/2019  WBC 4.0 - 10.5 K/uL 12.8(H) 10.1 14.4(H)  Hemoglobin 12.0 - 15.0 g/dL 7.7(L) 7.3(L) 8.8(L)  Hematocrit 36.0 - 46.0 % 24.6(L) 23.8(L) 28.6(L)  Platelets 150 - 400 K/uL 235 207 196   BMP Latest Ref Rng & Units 02/19/2019 02/19/2019 02/19/2019  Glucose 70 - 99 mg/dL - 125(H) -  BUN 8 - 23 mg/dL - 25(H) -  Creatinine 0.44 - 1.00 mg/dL - 0.54 -  Sodium 135 - 145 mmol/L 158(H) 154(H) 155(H)  Potassium 3.5 - 5.1 mmol/L - 3.1(L) -  Chloride 98 - 111 mmol/L - 121(H) -  CO2 22 - 32 mmol/L - 22 -  Calcium 8.9 - 10.3 mg/dL - 7.4(L) -    Intake/Output Summary (Last 24 hours) at 02/19/2019 1321 Last data filed at 02/19/2019 1000 Gross per 24 hour  Intake 651.32 ml  Output 1160 ml  Net -508.68 ml    Current Facility-Administered Medications:  .  0.9 %  sodium chloride infusion, , Intravenous, Continuous, Ausha Sieh, Joyice Faster, MD, Last Rate: 10 mL/hr at 02/19/19 0700 .  acetaminophen (TYLENOL) tablet 650 mg, 650 mg, Oral, Q4H PRN **OR** acetaminophen (TYLENOL) suppository 650 mg, 650 mg, Rectal, Q4H PRN, Marvie Calender A, MD .  chlorhexidine gluconate (MEDLINE KIT) (PERIDEX) 0.12 % solution 15 mL, 15 mL, Mouth Rinse, BID, Deterding, Guadelupe Sabin, MD, 15 mL at 02/19/19 0845 .  Chlorhexidine Gluconate Cloth 2 % PADS 6 each, 6 each, Topical, Daily, Judith Part, MD, 6 each at 02/17/19 0900 .  Chlorhexidine Gluconate Cloth 2 % PADS 6 each, 6 each, Topical, Daily, Judith Part, MD, 6 each at 02/19/19 1224 .  dexamethasone  (DECADRON) injection 10 mg, 10 mg, Intravenous, Q6H, Costella, Vincent J, PA-C, 10 mg at 02/19/19 1222 .  feeding supplement (VITAL AF 1.2 CAL) liquid 1,000 mL, 1,000 mL, Per Tube, Continuous, Teonia Yager, Joyice Faster, MD, Last Rate: 45 mL/hr at 02/18/19 1900 .  fentaNYL (SUBLIMAZE) injection 25-100 mcg, 25-100 mcg, Intravenous, Q2H PRN, Rigoberto Noel, MD, 100 mcg at 02/19/19 1048 .  heparin injection 5,000 Units, 5,000 Units, Subcutaneous, Q8H, Kala Ambriz, Joyice Faster, MD, 5,000 Units at 02/19/19 0525 .  hydrALAZINE (APRESOLINE) injection 10 mg, 10 mg, Intravenous, Q1H PRN, Judith Part, MD, 10 mg at 02/19/19 0209 .  HYDROmorphone (DILAUDID) injection 0.5 mg, 0.5 mg, Intravenous, Q3H PRN, Judith Part, MD, 0.5 mg at 02/18/19 0344 .  insulin aspart (novoLOG) injection 0-9 Units, 0-9 Units, Subcutaneous, Q4H, Deterding, Guadelupe Sabin, MD, 1 Units at 02/19/19 1229 .  labetalol (NORMODYNE) injection 10-40 mg, 10-40 mg, Intravenous, Q10 min PRN, Judith Part, MD, 10 mg at 02/15/19 0207 .  levETIRAcetam (KEPPRA) IVPB 1000 mg/100 mL premix, 1,000 mg, Intravenous, Q12H, Judith Part, MD, Stopped at 02/19/19 0541 .  LORazepam (ATIVAN) injection 2 mg, 2 mg, Intravenous, Q2H PRN, Judith Part, MD, 2 mg at 02/19/19 0415 .  MEDLINE  mouth rinse, 15 mL, Mouth Rinse, 10 times per day, Deterding, Guadelupe Sabin, MD, 15 mL at 02/19/19 1225 .  ondansetron (ZOFRAN) tablet 4 mg, 4 mg, Oral, Q4H PRN **OR** ondansetron (ZOFRAN) injection 4 mg, 4 mg, Intravenous, Q4H PRN, Add Dinapoli, Joyice Faster, MD .  oxyCODONE (Oxy IR/ROXICODONE) immediate release tablet 10 mg, 10 mg, Oral, Q4H PRN, Judith Part, MD .  oxyCODONE (Oxy IR/ROXICODONE) immediate release tablet 5 mg, 5 mg, Oral, Q4H PRN, Judith Part, MD .  pantoprazole sodium (PROTONIX) 40 mg/20 mL oral suspension 40 mg, 40 mg, Per Tube, Daily, Judith Part, MD, 40 mg at 02/19/19 1027 .  promethazine (PHENERGAN) tablet 12.5-25 mg,  12.5-25 mg, Oral, Q4H PRN, Lasalle Abee, Joyice Faster, MD .  senna-docusate (Senokot-S) tablet 1 tablet, 1 tablet, Oral, QHS PRN, Ashan Cueva A, MD .  sodium chloride flush (NS) 0.9 % injection 10-40 mL, 10-40 mL, Intracatheter, Q12H, Jorma Tassinari A, MD, 10 mL at 02/19/19 1000 .  sodium chloride flush (NS) 0.9 % injection 10-40 mL, 10-40 mL, Intracatheter, PRN, Judith Part, MD   Physical Exam: Intubated, no sedation, eyes closed to stim, PERRL, some extension bilaterally, but more spontaneous movement today Incision c/d/i  Assessment and Plan: 63 y.o. 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 7/2 s/p craniotomy for tumor resection, post-op normal recovery from ETGA then progressive loss of exam, CTH w/ large post-op hematoma s/p emergent clot evac. 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 If she recovers, will have to discuss with oncology about next steps, I presume a PET CT. -SCDs/TEDs/SQH  Marcello Moores A Anilah Huck  02/19/19 1:21 PM

## 2019-02-19 NOTE — Progress Notes (Signed)
RT note: holding SBT on patient this AM due to on continuous EEG.  Tolerating current ventilator settings well.  Will continue to monitor.

## 2019-02-19 NOTE — Progress Notes (Signed)
NAME:  Donna Delgado, MRN:  [id number], DOB:  09-28-1955, LOS: 9 ADMISSION DATE:  01/19/2019, CONSULTATION DATE:  02/21/2019 REFERRING MD:  Neurosurgery, CHIEF COMPLAINT:  weakness  Brief History   Donna Delgado is a 63 year old woman with no reported PMH, presented on 6/28 with subacute on chronic R arm weakness, found to have a 3.4 cm  frontoparietal enhancing mass with compression and vasogenic edema.  She was taken to OR for tumor exision 7/2 complicated by post op hematoma, taken back for evacuation same day.   History of present illness   Other sx included R foot weakness, falls.  Metastatic work-up with CT chest, abdomen, and pelvis to identify a possible primary malignancy.  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  Started on decadron on 6/29 7/2 OR Tumor resection with 1200 EBL  Post operatively became more obtunded, CTH - hematoma at tumor resection bed.   Repeat crani for evacuation, 300EBL.   Non responsive post operatively, non reactive pupils.  NSG reviewed repeat scan following second surgery --  "significant surrounding edema which I suspect is a combination of cytotoxic and vasogenic edema. I do not see a need for repeat surgical evacuation as the actual hematoma appears to occupy the resection cavity. Will start her on 1cc/kg hypertonic saline and cont dex 10q6"   Past Medical History  No Known hx.  Emphysema on CT chest [facility] Events   Neurosurgery 7/2   Consults:  PCCM 7/2  Procedures:  7/2 brain tumor excision 7/2 hematoma evacuation  Significant Diagnostic Tests:  7/2 CT Head- 1. Regressed intracranial mass effect - including restored basilar cistern patency - following evacuation of postoperative hematoma. 2. Residual gas and blood in the resection cavity and residual rightward midline shift  of 12-14 mm (previously 18 mm). 3. Stable mild asymmetric enlargement of the right lateral ventricle.   7/3 MRI >>Patient has had left frontoparietal craniotomy for tumor resection and subsequently for hematoma evacuation. Residual hematoma on the order of 5.5 cm in diameter. This study does not show any tissue that enhances additionally after today's gadolinium administration. T1 bright tissue in the region of surgery prior to contrast administration is felt to represent blood products.   Mass effect with left-to-right shift of 11 mm, reduced from most recent head CT where it measured 13 mm.   Micro Data:  MRSA surveillance negative SARSCoV2 negative   Antimicrobials:  Cefazolin SCIP 7/2  Interim history/subjective:  Noted to have seizure-like activity. Gnosis appears to be grim  Objective   Blood pressure (!) 153/84, pulse 98, temperature (!) 97.3 F (36.3 C), temperature source Axillary, resp. rate (!) 23, height 5\' 3"  (1.6 m), weight 53.1 kg, SpO2 100 %.    Vent Mode: PRVC FiO2 (%):  [30 %] 30 % Set Rate:  [16 bmp] 16 bmp Vt Set:  [410 mL] 410 mL PEEP:  [5 cmH20] 5 cmH20 Plateau Pressure:  [9 cmH20-12 cmH20] 9 cmH20   Intake/Output Summary (Last 24 hours) at 02/19/2019 0913 Last data filed at 02/19/2019 0700 Gross per 24 hour  Intake 861.32 ml  Output 1160 ml  Net -298.68 ml   Filed Weights   02/16/19 0347 02/17/19 0500 02/18/19 0500  Weight: 50.3 kg 51 kg 53.1 kg    Examination: General: Well-nourished female HEENT: Tracheal tube is in place Neuro: Right gaze preference is  noted.  Currently on EEG.  Seizure activity noted on right side CV: Currently sinus rhythm 94 PULM: Mentioned to bases GI: Nontender faint bowel sounds Extremities: 2+ lower extremity edema Skin: no rashes or lesions   02/19/2019 no chest x-ray Resolved Hospital Problem list   N/A  Assessment & Plan:  LT frontotemporal brain mass, postop hematoma. Acute encephalopathy-due to  edema/shift Recent Labs  Lab 02/18/19 1828 02/19/19 0214 02/19/19 0540  NA 154* 155* 154*    Decadron per neurosurgery Hypertonic saline on hold note sodium 154 HOB elevated Maintain euglycemia, normothermia EEG noted to be abnormal Continue to monitor in intensive care unit  Acute post operative respiratory failure 2/2 above Currently undergoing EEG Not a candidate for extubation at this time Currently on 30% FiO2 and 5.  Acute blood loss anemia -globin continues to drop Recent Labs    02/18/19 0551 02/19/19 0540  HGB 7.3* 7.7*   Monitor transfuse per protocol  Intermittent Hypertension PRN Apresoline and Normodyne  Hyperglycemia: Due to dexamethasone CBG (last 3)  Recent Labs    02/18/19 2311 02/19/19 0323 02/19/19 0818  GLUCAP 131* 148* 144*    Sliding scale insulin protocol  Smoker:  DC nicotine patch 02/19/2019   Summary: Status post left craniotomy for evacuation of postoperative intracerebral hemorrhage after having undergone removal of brain tumor on 03/05/2019    Best practice:  Diet: NPO. TF per recs today   Pain/Anxiety/Delirium protocol (if indicated):  prn analgesia VAP protocol (if indicated): yes DVT prophylaxis: SCDs. No lovenox d/t bleeding GI prophylaxis: PPI Glucose control: SSI Mobility: bed rest  Code Status: Full Family Communication: Per neurosurgery Disposition: ICU  App cct 30 min  Richardson Landry Braelynn Lupton ACNP [facility] PCCM Pager [phone] till 1 pm If no answer page [phone] 02/19/2019, 9:18 AM

## 2019-02-19 NOTE — Plan of Care (Signed)
Pt not awakening.Extensor posturing uppers. Continue to monitor. Dr Zada Finders to update family.

## 2019-02-19 NOTE — Progress Notes (Addendum)
Subjective: She is slightly more responsive today, though continues to be fairly debilitated  Exam: Vitals:   02/19/19 1200 02/19/19 1531  BP:  133/75  Pulse:  77  Resp:  (!) 24  Temp: 98.9 F (37.2 C)   SpO2:  100%   Gen: In bed, NAD Resp: non-labored breathing, no acute distress Abd: soft, nt  Neuro: MS: Does not open eyes, but does respond to noxious stimulation with grimace, she does not follow commands CN: Pupils reactive bilaterally, with doll's eye maneuver, she does have movement bilaterally, but her left eye does not cross midline Motor: She has extensor posturing in the left arm, flexion of the right arm, mild posturing bilateral legs to noxious stimulation. Sensory: As above  Impression: 63 year old female with hemorrhage into the tumor bed after resection of a very large, very vascular tumor.  Her exam seems to be most consistent with brainstem pathology, however this was not borne out on MRI of the brain.  I do wonder if there was some injury that was not well visualized on the initial scan, and would favor repeating MRI.  Though seizure was certainly possible, given that she has sharp waves on EEG, it is very unusual for seizure to cause pupillary response to be absent.  Hypoxic brain injury is another etiology that can cause this exam without clear imaging correlate, however there was no clear severe hypotensive episode.  Recommendations: 1) repeat MRI of the brain 2) can discontinue EEG monitoring 3) neurology to follow  This patient is critically ill and at significant risk of neurological worsening, death and care requires constant monitoring of vital signs, hemodynamics,respiratory and cardiac monitoring, neurological assessment, discussion with family, other specialists and medical decision making of high complexity. I spent 35 minutes of neurocritical care time  in the care of  this patient. This was time spent independent of any time provided by nurse practitioner  or PA.  Roland Rack, MD [facility] [phone]  If 7pm- 7am, please page neurology on call as listed in [place]. 02/19/2019  3:59 PM

## 2019-02-19 NOTE — Procedures (Signed)
LTM-EEG Report  HISTORY: Continuous video-EEG monitoring performed for 63 year old with brain mass, seizures. ACQUISITION: International 10-20 system for electrode placement; 18 channels with additional eyes linked to ipsilateral ears and EKG. Additional T1-T2 electrodes were used. Continuous video recording obtained.   EEG NUMBER:  MEDICATIONS:  Day 1: see EMR  DAY #1: from 9407 02/18/19 to 0730 02/19/19 BACKGROUND: An overall medium voltage continuous recording with poor spontaneous variability and reactivity. Waking background consisted of medium voltage theta-delta activity bilaterally with sparse fast activity. There was superimposed focal delta slowing in the left hemisphere throughout the record. Sleep was captured with normal stage II sleep architecture.   EPILEPTIFORM/PERIODIC ACTIVITY: Rare/occasional isolated sharp waves in the left posterior region (P3 maximal) occurring in isolation without ictal evolution  SEIZURES: none EVENTS: none reported  EKG: no significant arrhythmia  SUMMARY: This was an abnormal continuous video EEG due to diffuse slowing and superimposed focal slowing with epileptiform discharges in the left posterior regions. No seizures were seen. This was consistent with a diffuse encephalopathy pattern with a superimposed focal epileptogenic disturbance on the left.

## 2019-02-20 ENCOUNTER — Inpatient Hospital Stay (HOSPITAL_COMMUNITY): Payer: BC Managed Care – PPO

## 2019-02-20 DIAGNOSIS — G9389 Other specified disorders of brain: Secondary | ICD-10-CM | POA: Diagnosis not present

## 2019-02-20 LAB — GLUCOSE, CAPILLARY
Glucose-Capillary: 151 mg/dL — ABNORMAL HIGH (ref 70–99)
Glucose-Capillary: 97 mg/dL (ref 70–99)
Glucose-Capillary: 112 mg/dL — ABNORMAL HIGH (ref 70–99)
Glucose-Capillary: 104 mg/dL — ABNORMAL HIGH (ref 70–99)
Glucose-Capillary: 114 mg/dL — ABNORMAL HIGH (ref 70–99)
Glucose-Capillary: 121 mg/dL — ABNORMAL HIGH (ref 70–99)

## 2019-02-20 LAB — CBC WITH DIFFERENTIAL/PLATELET
Abs Immature Granulocytes: 0.13 10*3/uL — ABNORMAL HIGH (ref 0.00–0.07)
Basophils Absolute: 0 10*3/uL (ref 0.0–0.1)
Basophils Relative: 0 %
Eosinophils Absolute: 0 10*3/uL (ref 0.0–0.5)
Eosinophils Relative: 0 %
HCT: 22.2 % — ABNORMAL LOW (ref 36.0–46.0)
Hemoglobin: 6.7 g/dL — CL (ref 12.0–15.0)
Immature Granulocytes: 1 %
Lymphocytes Relative: 8 %
Lymphs Abs: 0.9 10*3/uL (ref 0.7–4.0)
MCH: 33.2 pg (ref 26.0–34.0)
MCHC: 30.2 g/dL (ref 30.0–36.0)
MCV: 109.9 fL — ABNORMAL HIGH (ref 80.0–100.0)
Monocytes Absolute: 0.7 10*3/uL (ref 0.1–1.0)
Monocytes Relative: 6 %
Neutro Abs: 9.5 10*3/uL — ABNORMAL HIGH (ref 1.7–7.7)
Neutrophils Relative %: 85 %
Platelets: 225 10*3/uL (ref 150–400)
RBC: 2.02 MIL/uL — ABNORMAL LOW (ref 3.87–5.11)
RDW: 15.9 % — ABNORMAL HIGH (ref 11.5–15.5)
WBC: 11.2 10*3/uL — ABNORMAL HIGH (ref 4.0–10.5)
nRBC: 0 % (ref 0.0–0.2)

## 2019-02-20 LAB — PREPARE RBC (CROSSMATCH)

## 2019-02-20 LAB — CBC
HCT: 30.4 % — ABNORMAL LOW (ref 36.0–46.0)
Hemoglobin: 9.7 g/dL — ABNORMAL LOW (ref 12.0–15.0)
MCH: 31.7 pg (ref 26.0–34.0)
MCHC: 31.9 g/dL (ref 30.0–36.0)
MCV: 99.3 fL (ref 80.0–100.0)
Platelets: 267 10*3/uL (ref 150–400)
RBC: 3.06 MIL/uL — ABNORMAL LOW (ref 3.87–5.11)
RDW: 19.3 % — ABNORMAL HIGH (ref 11.5–15.5)
WBC: 15.3 10*3/uL — ABNORMAL HIGH (ref 4.0–10.5)
nRBC: 0 % (ref 0.0–0.2)

## 2019-02-20 LAB — BASIC METABOLIC PANEL
Anion gap: 9 (ref 5–15)
BUN: 23 mg/dL (ref 8–23)
CO2: 21 mmol/L — ABNORMAL LOW (ref 22–32)
Calcium: 6.7 mg/dL — ABNORMAL LOW (ref 8.9–10.3)
Chloride: 123 mmol/L — ABNORMAL HIGH (ref 98–111)
Creatinine, Ser: 0.61 mg/dL (ref 0.44–1.00)
GFR calc Af Amer: >60 mL/min (ref >60)
GFR calc non Af Amer: >60 mL/min (ref >60)
Glucose, Bld: 102 mg/dL — ABNORMAL HIGH (ref 70–99)
Potassium: 2.7 mmol/L — CL (ref 3.5–5.1)
Sodium: 153 mmol/L — ABNORMAL HIGH (ref 135–145)

## 2019-02-20 LAB — SODIUM
Sodium: 156 mmol/L — ABNORMAL HIGH (ref 135–145)
Sodium: 155 mmol/L — ABNORMAL HIGH (ref 135–145)
Sodium: 153 mmol/L — ABNORMAL HIGH (ref 135–145)

## 2019-02-20 LAB — MAGNESIUM: Magnesium: 2 mg/dL (ref 1.7–2.4)

## 2019-02-20 LAB — PHOSPHORUS: Phosphorus: 2.9 mg/dL (ref 2.5–4.6)

## 2019-02-20 MED ORDER — SODIUM CHLORIDE 0.9% IV SOLUTION
Freq: Once | INTRAVENOUS | Status: AC
  Administered 2019-02-20: 09:00:00 via INTRAVENOUS

## 2019-02-20 MED ORDER — POTASSIUM CHLORIDE 10 MEQ/50ML IV SOLN
10.0000 meq | INTRAVENOUS | Status: AC
  Administered 2019-02-20 (×6): 10 meq via INTRAVENOUS
  Filled 2019-02-20 (×6): qty 50

## 2019-02-20 MED ORDER — CALCIUM GLUCONATE-NACL 1-0.675 GM/50ML-% IV SOLN
1.0000 g | Freq: Once | INTRAVENOUS | Status: AC
  Administered 2019-02-20: 08:00:00 1000 mg via INTRAVENOUS
  Filled 2019-02-20: qty 50

## 2019-02-20 MED ORDER — DEXAMETHASONE SODIUM PHOSPHATE 10 MG/ML IJ SOLN
2.0000 mg | Freq: Two times a day (BID) | INTRAMUSCULAR | Status: DC
  Administered 2019-02-20 (×2): 2 mg via INTRAVENOUS
  Filled 2019-02-20 (×2): qty 1

## 2019-02-20 NOTE — Progress Notes (Signed)
LTM discontinued; no skin breakdown was seen. 

## 2019-02-20 NOTE — Progress Notes (Signed)
Subjective: No significant change.   Exam: Vitals:   02/20/19 1600 02/20/19 1616  BP:  (!) 176/107  Pulse:    Resp:    Temp: 99.6 F (37.6 C)   SpO2:     Gen: In bed, intubated Resp: non-labored breathing, no acute distress Abd: soft, nt  Neuro: MS: Does not open eyes, but does respond to noxious stimulation with grimace, she does not follow commands CN: Pupils reactive bilaterally, with doll's eye maneuver, she does have movement bilaterally, but her left eye does not cross midline Motor: She has extensor posturing in bilateral arms and legs.  Sensory: As above  Impression: 63 year old female with hemorrhage into the tumor bed after resection of a very large, very vascular tumor.  Her exam seems to be most consistent with brainstem pathology, however this was not borne out on MRI of the brain.  I do wonder if there was some injury that was not well visualized on the initial scan, and would favor repeating MRI.    Though seizure was certainly possible, given that she has sharp waves on EEG, seizure would not explain her current exam.   The sharps on EEG were intermittent interictal findings. I would not pursue treatment as these did not represent seizures, just an area of irritability.   Recommendations: 1) repeat MRI of the brain pending 2) neurology to follow   Roland Rack, MD [facility] [phone]  If 7pm- 7am, please page neurology on call as listed in [place]. 02/20/2019  6:28 PM

## 2019-02-20 NOTE — Progress Notes (Signed)
NAME:  Donna Delgado, MRN:  [id number], DOB:  August 26, 1955, LOS: 64 ADMISSION DATE:  01/29/2019, CONSULTATION DATE:  02/16/2019 REFERRING MD:  Neurosurgery, CHIEF COMPLAINT:  weakness  Brief History   Donna Delgado is a 63 year old woman with no reported PMH, presented on 6/28 with subacute on chronic R arm weakness, found to have a 3.4 cm  frontoparietal enhancing mass with compression and vasogenic edema.  She was taken to OR for tumor exision 7/2 complicated by post op hematoma, taken back for evacuation same day.   History of present illness   Other sx included R foot weakness, falls.  Metastatic work-up with CT chest, abdomen, and pelvis to identify a possible primary malignancy.  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  Started on decadron on 6/29 7/2 OR Tumor resection with 1200 EBL  Post operatively became more obtunded, CTH - hematoma at tumor resection bed.   Repeat crani for evacuation, 300EBL.   Non responsive post operatively, non reactive pupils.  NSG reviewed repeat scan following second surgery --  "significant surrounding edema which I suspect is a combination of cytotoxic and vasogenic edema. I do not see a need for repeat surgical evacuation as the actual hematoma appears to occupy the resection cavity. Will start her on 1cc/kg hypertonic saline and cont dex 10q6"   Past Medical History  No Known hx.  Emphysema on CT chest [place] Events   Neurosurgery 7/2   Consults:  PCCM 7/2  Procedures:  7/2 brain tumor excision 7/2 hematoma evacuation  Significant Diagnostic Tests:  7/2 CT Head- 1. Regressed intracranial mass effect - including restored basilar cistern patency - following evacuation of postoperative hematoma. 2. Residual gas and blood in the resection cavity and residual rightward midline  shift of 12-14 mm (previously 18 mm). 3. Stable mild asymmetric enlargement of the right lateral ventricle.   7/3 MRI >>Patient has had left frontoparietal craniotomy for tumor resection and subsequently for hematoma evacuation. Residual hematoma on the order of 5.5 cm in diameter. This study does not show any tissue that enhances additionally after today's gadolinium administration. T1 bright tissue in the region of surgery prior to contrast administration is felt to represent blood products.   Mass effect with left-to-right shift of 11 mm, reduced from most recent head CT where it measured 13 mm.  Plan for MRI 02/20/2019>>   Micro Data:  MRSA surveillance negative SARSCoV2 negative   Antimicrobials:  Cefazolin SCIP 7/2  Interim history/subjective:  Scheduled for MRI today.  Objective   Blood pressure 102/62, pulse 71, temperature 99.5 F (37.5 C), temperature source Axillary, resp. rate 14, height 5\' 3"  (1.6 m), weight 52.8 kg, SpO2 100 %.    Vent Mode: PSV;CPAP FiO2 (%):  [30 %] 30 % Set Rate:  [16 bmp] 16 bmp Vt Set:  [410 mL] 410 mL PEEP:  [5 cmH20] 5 cmH20 Pressure Support:  [10 cmH20] 10 cmH20 Plateau Pressure:  [10 cmH20-16 cmH20] 16 cmH20   Intake/Output Summary (Last 24 hours) at 02/20/2019 0833 Last data filed at 02/20/2019 0800 Gross per 24 hour  Intake 1626.8 ml  Output 401 ml  Net 1225.8 ml   Filed Weights   02/17/19 0500 02/18/19 0500 02/20/19 0500  Weight: 51 kg 53.1 kg 52.8 kg    Examination: General: Slender female currently pressure support ventilation HEENT: Endotracheal tube is  in place.  Gastric tube is in place.  Left cranial  incision well approximated Neuro: Right gaze preference CV: Heart sounds regular regular rate and rhythm PULM: Decreased breath sounds in bases LP:FXTK, non-tender, bsx4 active  Extremities: warm/dry, 1+ edema Skin: no rashes or lesions    02/19/2019 no chest x-ray Resolved Hospital Problem list   N/A   Assessment & Plan:  LT frontotemporal brain mass, postop hematoma. Acute encephalopathy-due to edema/shift Recent Labs  Lab 02/19/19 1659 02/20/19 0000 02/20/19 0550  NA 158* 156* 153*    Decadron per neurosurgery Hypertonic saline is on hold Maintain head of the bed elevation EEG noted to be abnormal Continue to monitor in intensive care unit  Acute post operative respiratory failure 2/2 above Currently tolerating pressure support although her mental status precludes extubation Scheduled for MRI today further evaluation after the MRI is completed.  Acute blood loss anemia -globin continues to drop Recent Labs    02/19/19 0540 02/20/19 0550  HGB 7.7* 6.7*   Transfuse per protocol.  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  Prognosis appears to be poor.    Best practice:  Diet: NPO. TF per recs today   Pain/Anxiety/Delirium protocol (if indicated):  prn analgesia VAP protocol (if indicated): yes DVT prophylaxis: SCDs. No lovenox d/t bleeding GI prophylaxis: PPI Glucose control: SSI Mobility: bed rest  Code Status: Full Family Communication: Per neurosurgery Disposition: ICU  App cct 30 min  Richardson Landry Felicie Kocher ACNP [facility] PCCM Pager [phone] till 1 pm If no answer page [phone] 02/20/2019, 8:33 AM

## 2019-02-20 NOTE — Procedures (Signed)
LTM-EEG Report  HISTORY: Continuous video-EEG monitoring performed for 63 year old with brain mass, seizures. ACQUISITION: International 10-20 system for electrode placement; 18 channels with additional eyes linked to ipsilateral ears and EKG. Additional T1-T2 electrodes were used. Continuous video recording obtained.   EEG NUMBER:  MEDICATIONS:  Day 2: see EMR  DAY #2: from 0730 02/19/19 to 2006 02/20/19 BACKGROUND: An overall medium voltage continuous recording with poor spontaneous variability and reactivity. Waking background consisted of medium voltage theta-delta activity bilaterally with sparse fast activity. There was superimposed focal delta slowing in the left hemisphere throughout the record. Sleep was captured with normal stage II sleep architecture.   EPILEPTIFORM/PERIODIC ACTIVITY: Rare/occasional isolated sharp waves in the left posterior region (P3 maximal) occurring in isolation without ictal evolution  SEIZURES: none EVENTS: none reported  EKG: no significant arrhythmia  SUMMARY: This was an abnormal continuous video EEG due to diffuse slowing and superimposed focal slowing with epileptiform discharges in the left posterior regions. No seizures were seen. This was consistent with a diffuse encephalopathy pattern with a superimposed focal epileptogenic disturbance on the left.

## 2019-02-20 NOTE — Progress Notes (Signed)
Neurosurgery Service Progress Note  Subjective: No acute events overnight  Objective: Vitals:   02/20/19 0600 02/20/19 0700 02/20/19 0736 02/20/19 0800  BP: 101/67 (!) 137/92 (!) 137/92 102/62  Pulse: 73 80 74 71  Resp: '18 19 12 14  ' Temp:    99.5 F (37.5 C)  TempSrc:    Axillary  SpO2: 99% 100% 100% 100%  Weight:      Height:       Temp (24hrs), Avg:99 F (37.2 C), Min:98 F (36.7 C), Max:99.5 F (37.5 C)  CBC Latest Ref Rng & Units 02/20/2019 02/19/2019 02/18/2019  WBC 4.0 - 10.5 K/uL 11.2(H) 12.8(H) 10.1  Hemoglobin 12.0 - 15.0 g/dL 6.7(LL) 7.7(L) 7.3(L)  Hematocrit 36.0 - 46.0 % 22.2(L) 24.6(L) 23.8(L)  Platelets 150 - 400 K/uL 225 235 207   BMP Latest Ref Rng & Units 02/20/2019 02/20/2019 02/19/2019  Glucose 70 - 99 mg/dL 102(H) - -  BUN 8 - 23 mg/dL 23 - -  Creatinine 0.44 - 1.00 mg/dL 0.61 - -  Sodium 135 - 145 mmol/L 153(H) 156(H) 158(H)  Potassium 3.5 - 5.1 mmol/L 2.7(LL) - -  Chloride 98 - 111 mmol/L 123(H) - -  CO2 22 - 32 mmol/L 21(L) - -  Calcium 8.9 - 10.3 mg/dL 6.7(L) - -    Intake/Output Summary (Last 24 hours) at 02/20/2019 0821 Last data filed at 02/20/2019 0800 Gross per 24 hour  Intake 1626.8 ml  Output 401 ml  Net 1225.8 ml    Current Facility-Administered Medications:  .  0.9 %  sodium chloride infusion (Manually program via Guardrails IV Fluids), , Intravenous, Once, Anders Simmonds, MD .  0.9 %  sodium chloride infusion, , Intravenous, Continuous, Ezinne Yogi, Joyice Faster, MD, Stopped at 02/20/19 0701 .  acetaminophen (TYLENOL) tablet 650 mg, 650 mg, Oral, Q4H PRN **OR** acetaminophen (TYLENOL) suppository 650 mg, 650 mg, Rectal, Q4H PRN, Stephanine Reas A, MD .  calcium gluconate 1 g/ 50 mL sodium chloride IVPB, 1 g, Intravenous, Once, Anders Simmonds, MD, Last Rate: 50 mL/hr at 02/20/19 0800 .  chlorhexidine gluconate (MEDLINE KIT) (PERIDEX) 0.12 % solution 15 mL, 15 mL, Mouth Rinse, BID, Deterding, Guadelupe Sabin, MD, 15 mL at 02/20/19 0743 .   Chlorhexidine Gluconate Cloth 2 % PADS 6 each, 6 each, Topical, Daily, Judith Part, MD, 6 each at 02/17/19 0900 .  Chlorhexidine Gluconate Cloth 2 % PADS 6 each, 6 each, Topical, Daily, Judith Part, MD, 6 each at 02/19/19 1224 .  dexamethasone (DECADRON) injection 4 mg, 4 mg, Intravenous, Q12H, Zyria Fiscus, Joyice Faster, MD, 4 mg at 02/19/19 2300 .  feeding supplement (VITAL AF 1.2 CAL) liquid 1,000 mL, 1,000 mL, Per Tube, Continuous, Justyce Yeater, Joyice Faster, MD, Last Rate: 45 mL/hr at 02/20/19 0800 .  fentaNYL (SUBLIMAZE) injection 25-100 mcg, 25-100 mcg, Intravenous, Q2H PRN, Rigoberto Noel, MD, 100 mcg at 02/20/19 0728 .  heparin injection 5,000 Units, 5,000 Units, Subcutaneous, Q8H, Sumeet Geter, Joyice Faster, MD, 5,000 Units at 02/20/19 0546 .  hydrALAZINE (APRESOLINE) injection 10 mg, 10 mg, Intravenous, Q1H PRN, Judith Part, MD, 10 mg at 02/19/19 0209 .  HYDROmorphone (DILAUDID) injection 0.5 mg, 0.5 mg, Intravenous, Q3H PRN, Judith Part, MD, 0.5 mg at 02/18/19 0344 .  insulin aspart (novoLOG) injection 0-9 Units, 0-9 Units, Subcutaneous, Q4H, Deterding, Guadelupe Sabin, MD, 2 Units at 02/20/19 0400 .  labetalol (NORMODYNE) injection 10-40 mg, 10-40 mg, Intravenous, Q10 min PRN, Judith Part, MD, 10 mg at 02/15/19 0207 .  levETIRAcetam (KEPPRA) IVPB 1000 mg/100 mL premix, 1,000 mg, Intravenous, Q12H, Judith Part, MD, Last Rate: 400 mL/hr at 02/20/19 0546, 1,000 mg at 02/20/19 0546 .  LORazepam (ATIVAN) injection 2 mg, 2 mg, Intravenous, Q2H PRN, Judith Part, MD, 2 mg at 02/19/19 2300 .  MEDLINE mouth rinse, 15 mL, Mouth Rinse, 10 times per day, Deterding, Guadelupe Sabin, MD, 15 mL at 02/20/19 0400 .  ondansetron (ZOFRAN) tablet 4 mg, 4 mg, Oral, Q4H PRN **OR** ondansetron (ZOFRAN) injection 4 mg, 4 mg, Intravenous, Q4H PRN, Ezmae Speers, Joyice Faster, MD .  oxyCODONE (Oxy IR/ROXICODONE) immediate release tablet 10 mg, 10 mg, Oral, Q4H PRN, Judith Part, MD .   oxyCODONE (Oxy IR/ROXICODONE) immediate release tablet 5 mg, 5 mg, Oral, Q4H PRN, Judith Part, MD .  pantoprazole sodium (PROTONIX) 40 mg/20 mL oral suspension 40 mg, 40 mg, Per Tube, Daily, Judith Part, MD, 40 mg at 02/19/19 1027 .  potassium chloride 10 mEq in 50 mL *CENTRAL LINE* IVPB, 10 mEq, Intravenous, Q1 Hr x 6, Anders Simmonds, MD, Last Rate: 50 mL/hr at 02/20/19 0702 .  promethazine (PHENERGAN) tablet 12.5-25 mg, 12.5-25 mg, Oral, Q4H PRN, Conley Delisle, Joyice Faster, MD .  senna-docusate (Senokot-S) tablet 1 tablet, 1 tablet, Oral, QHS PRN, Adonica Fukushima A, MD .  sodium chloride flush (NS) 0.9 % injection 10-40 mL, 10-40 mL, Intracatheter, Q12H, Shelda Truby, Joyice Faster, MD, 10 mL at 02/19/19 2200 .  sodium chloride flush (NS) 0.9 % injection 10-40 mL, 10-40 mL, Intracatheter, PRN, Judith Part, MD   Physical Exam: Intubated, no sedation, eyes closed to stim but grimaces and moves neck axially to pain, PERRL, some extension bilaterally, no spontaneous movement today Incision c/d/i  Assessment and Plan: 63 y.o. 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 7/2 s/p craniotomy for tumor resection, post-op normal recovery from ETGA then progressive loss of exam, CTH w/ large post-op hematoma s/p emergent clot evac. 7/3 post-op CTH improved, 7/4 MRI with GTR, improved midline shift, 7/5 rpt CTH with continued decrease in midline shift  -LTM EEG with L P3 sharps, no evolution to seizures - will see what Dr. Leonel Ramsay things about treating these  -repeat MRI pending, lots of stat scans overnight so hopefully will be done today -dex to 2bid -I updated the patient's daughter yesterday, explained that it's not clear why she's not waking up, but it is certainly very concerning and that I'm worried that she has not improved substantially post-op. I also told her about the path results (lung adeno) and that this would be addressed in  the future. -the patient's exam is poor, but without a diagnosis to explain the cause of such a poor exam, I am reluctant to recommend that we switch to palliative care until we have more data. -SCDs/TEDs/SQH  Joyice Faster Trejon Duford  02/20/19 8:21 AM

## 2019-02-20 NOTE — Progress Notes (Signed)
Transported pt to MRI and back to [id number] without incident.

## 2019-02-20 NOTE — Progress Notes (Addendum)
[facility] Progress Note Patient Name: [name] DOB: April 24, 1956 MRN: [id number]   Date of Service  02/20/2019  HPI/Events of Note  Multiple issues - 1. Hypokalemia - K+ = 2.7 and Creatinine = 0.61, 2. Ca++ = 6.7 and 3. Anemia - Hgb = 6.7.   eICU Interventions  Will order: 1. Replace K+. 2. Transfuse 1 unit PRBC. 3. Replace Ca++.      Intervention Category Major Interventions: Other:;Electrolyte abnormality - evaluation and management  Dreonna Hussein Eugene 02/20/2019, 6:50 AM

## 2019-02-21 ENCOUNTER — Inpatient Hospital Stay (HOSPITAL_COMMUNITY): Payer: BC Managed Care – PPO

## 2019-02-21 ENCOUNTER — Other Ambulatory Visit: Payer: Self-pay | Admitting: Radiation Therapy

## 2019-02-21 DIAGNOSIS — G9389 Other specified disorders of brain: Secondary | ICD-10-CM | POA: Diagnosis not present

## 2019-02-21 LAB — MAGNESIUM: Magnesium: 1.8 mg/dL (ref 1.7–2.4)

## 2019-02-21 LAB — BASIC METABOLIC PANEL
Anion gap: 9 (ref 5–15)
BUN: 16 mg/dL (ref 8–23)
CO2: 20 mmol/L — ABNORMAL LOW (ref 22–32)
Calcium: 6.7 mg/dL — ABNORMAL LOW (ref 8.9–10.3)
Chloride: 119 mmol/L — ABNORMAL HIGH (ref 98–111)
Creatinine, Ser: 0.44 mg/dL (ref 0.44–1.00)
GFR calc Af Amer: >60 mL/min (ref >60)
GFR calc non Af Amer: >60 mL/min (ref >60)
Glucose, Bld: 127 mg/dL — ABNORMAL HIGH (ref 70–99)
Potassium: 3.1 mmol/L — ABNORMAL LOW (ref 3.5–5.1)
Sodium: 148 mmol/L — ABNORMAL HIGH (ref 135–145)

## 2019-02-21 LAB — GLUCOSE, CAPILLARY
Glucose-Capillary: 132 mg/dL — ABNORMAL HIGH (ref 70–99)
Glucose-Capillary: 135 mg/dL — ABNORMAL HIGH (ref 70–99)
Glucose-Capillary: 111 mg/dL — ABNORMAL HIGH (ref 70–99)
Glucose-Capillary: 127 mg/dL — ABNORMAL HIGH (ref 70–99)
Glucose-Capillary: 103 mg/dL — ABNORMAL HIGH (ref 70–99)
Glucose-Capillary: 115 mg/dL — ABNORMAL HIGH (ref 70–99)

## 2019-02-21 LAB — BPAM RBC
Blood Product Expiration Date: 202008072359
ISSUE DATE / TIME: 202007080838
Unit Type and Rh: 5100

## 2019-02-21 LAB — TYPE AND SCREEN
ABO/RH(D): O POS
Antibody Screen: NEGATIVE
Unit division: 0

## 2019-02-21 LAB — SODIUM
Sodium: 148 mmol/L — ABNORMAL HIGH (ref 135–145)
Sodium: 151 mmol/L — ABNORMAL HIGH (ref 135–145)
Sodium: 148 mmol/L — ABNORMAL HIGH (ref 135–145)

## 2019-02-21 LAB — PHOSPHORUS: Phosphorus: 2.7 mg/dL (ref 2.5–4.6)

## 2019-02-21 MED ORDER — LIDOCAINE HCL (PF) 1 % IJ SOLN
INTRAMUSCULAR | Status: AC
  Filled 2019-02-21: qty 5

## 2019-02-21 NOTE — Progress Notes (Addendum)
Neurosurgery Service Progress Note  Subjective: No acute events overnight  Objective: Vitals:   02/21/19 0400 02/21/19 0500 02/21/19 0600 02/21/19 0700  BP: (!) 98/59 (!) 157/100 (!) 154/86 96/63  Pulse: 81 98 86 72  Resp: 20 (!) 25 (!) 23 17  Temp: 100.1 F (37.8 C)     TempSrc: Axillary     SpO2: 97% 98% 99% 100%  Weight:      Height:       Temp (24hrs), Avg:99 F (37.2 C), Min:98.2 F (36.8 C), Max:100.1 F (37.8 C)  CBC Latest Ref Rng & Units 02/20/2019 02/20/2019 02/19/2019  WBC 4.0 - 10.5 K/uL 15.3(H) 11.2(H) 12.8(H)  Hemoglobin 12.0 - 15.0 g/dL 9.7(L) 6.7(LL) 7.7(L)  Hematocrit 36.0 - 46.0 % 30.4(L) 22.2(L) 24.6(L)  Platelets 150 - 400 K/uL 267 225 235   BMP Latest Ref Rng & Units 02/21/2019 02/21/2019 02/20/2019  Glucose 70 - 99 mg/dL 127(H) - -  BUN 8 - 23 mg/dL 16 - -  Creatinine 0.44 - 1.00 mg/dL 0.44 - -  Sodium 135 - 145 mmol/L 148(H) 148(H) 153(H)  Potassium 3.5 - 5.1 mmol/L 3.1(L) - -  Chloride 98 - 111 mmol/L 119(H) - -  CO2 22 - 32 mmol/L 20(L) - -  Calcium 8.9 - 10.3 mg/dL 6.7(L) - -    Intake/Output Summary (Last 24 hours) at 02/21/2019 0733 Last data filed at 02/21/2019 0700 Gross per 24 hour  Intake 2099.34 ml  Output 1350 ml  Net 749.34 ml    Current Facility-Administered Medications:  .  0.9 %  sodium chloride infusion, , Intravenous, Continuous, Terrica Duecker, Joyice Faster, MD, Last Rate: 10 mL/hr at 02/21/19 0700 .  acetaminophen (TYLENOL) tablet 650 mg, 650 mg, Oral, Q4H PRN **OR** acetaminophen (TYLENOL) suppository 650 mg, 650 mg, Rectal, Q4H PRN, Taraya Steward A, MD .  chlorhexidine gluconate (MEDLINE KIT) (PERIDEX) 0.12 % solution 15 mL, 15 mL, Mouth Rinse, BID, Deterding, Guadelupe Sabin, MD, 15 mL at 02/20/19 2000 .  Chlorhexidine Gluconate Cloth 2 % PADS 6 each, 6 each, Topical, Daily, Judith Part, MD, 6 each at 02/17/19 0900 .  Chlorhexidine Gluconate Cloth 2 % PADS 6 each, 6 each, Topical, Daily, Judith Part, MD, 6 each at 02/20/19  1030 .  dexamethasone (DECADRON) injection 2 mg, 2 mg, Intravenous, Q12H, Ancel Easler, Joyice Faster, MD, 2 mg at 02/20/19 2300 .  feeding supplement (VITAL AF 1.2 CAL) liquid 1,000 mL, 1,000 mL, Per Tube, Continuous, Gaynelle Pastrana, Joyice Faster, MD, Last Rate: 45 mL/hr at 02/20/19 1900 .  fentaNYL (SUBLIMAZE) injection 25-100 mcg, 25-100 mcg, Intravenous, Q2H PRN, Rigoberto Noel, MD, 100 mcg at 02/20/19 1616 .  heparin injection 5,000 Units, 5,000 Units, Subcutaneous, Q8H, Zerina Hallinan, Joyice Faster, MD, 5,000 Units at 02/21/19 0513 .  hydrALAZINE (APRESOLINE) injection 10 mg, 10 mg, Intravenous, Q1H PRN, Judith Part, MD, 10 mg at 02/20/19 1617 .  HYDROmorphone (DILAUDID) injection 0.5 mg, 0.5 mg, Intravenous, Q3H PRN, Judith Part, MD, 0.5 mg at 02/18/19 0344 .  insulin aspart (novoLOG) injection 0-9 Units, 0-9 Units, Subcutaneous, Q4H, Deterding, Guadelupe Sabin, MD, 1 Units at 02/21/19 0430 .  labetalol (NORMODYNE) injection 10-40 mg, 10-40 mg, Intravenous, Q10 min PRN, Judith Part, MD, 10 mg at 02/15/19 0207 .  levETIRAcetam (KEPPRA) IVPB 1000 mg/100 mL premix, 1,000 mg, Intravenous, Q12H, Judith Part, MD, Stopped at 02/21/19 [phone] .  LORazepam (ATIVAN) injection 2 mg, 2 mg, Intravenous, Q2H PRN, Judith Part, MD, 2 mg at 02/19/19 2300 .  MEDLINE mouth rinse, 15 mL, Mouth Rinse, 10 times per day, Deterding, Guadelupe Sabin, MD, 15 mL at 02/21/19 0400 .  ondansetron (ZOFRAN) tablet 4 mg, 4 mg, Oral, Q4H PRN **OR** ondansetron (ZOFRAN) injection 4 mg, 4 mg, Intravenous, Q4H PRN, Ganon Demasi, Joyice Faster, MD .  oxyCODONE (Oxy IR/ROXICODONE) immediate release tablet 10 mg, 10 mg, Oral, Q4H PRN, Judith Part, MD .  oxyCODONE (Oxy IR/ROXICODONE) immediate release tablet 5 mg, 5 mg, Oral, Q4H PRN, Judith Part, MD .  pantoprazole sodium (PROTONIX) 40 mg/20 mL oral suspension 40 mg, 40 mg, Per Tube, Daily, Judith Part, MD, 40 mg at 02/20/19 0919 .  promethazine (PHENERGAN) tablet  12.5-25 mg, 12.5-25 mg, Oral, Q4H PRN, Dashanae Longfield, Joyice Faster, MD .  senna-docusate (Senokot-S) tablet 1 tablet, 1 tablet, Oral, QHS PRN, Yvonnia Tango A, MD .  sodium chloride flush (NS) 0.9 % injection 10-40 mL, 10-40 mL, Intracatheter, Q12H, Abeeha Twist A, MD, 10 mL at 02/20/19 0930 .  sodium chloride flush (NS) 0.9 % injection 10-40 mL, 10-40 mL, Intracatheter, PRN, Judith Part, MD   Physical Exam: Intubated, no sedation, opening right eye to stim, not left, PERRL, extending spontaneously in RUE / BLE, mixed flexion and extension in LUE Incision c/d/i  Assessment and Plan: 63 y.o. 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 7/2 s/p craniotomy for tumor resection, post-op normal recovery from ETGA then progressive loss of exam, CTH w/ large post-op hematoma s/p emergent clot evac. 7/3 post-op CTH improved, 7/4 MRI with GTR, improved midline shift, 7/5 rpt CTH with continued decrease in midline shift  -repeat MRI with diffusion changes in the splenium, L F lobe, L parietal lobe, L temporal lobe, R sup cerebellum. Not read by radiology but I think that there are some subtle diffusion changes in the bilateral thalami as well. The latter could explain her exam, but I am not aware of any episodes of hypotension or hypoxia in between the two MRIs. It's a very odd pattern of multiple different distributions and patterns - basal ganglia, WM, cerebellum, and cortex. Small vessel except for the left temporal / hippocampal, both anterior and posterior circulation. The left temporal includes both the mesial and lateral cortical surfaces. Without knowing a diagnosis, I am reluctant to try an empiric treatment of induced hypertension or nimodipine with concern that we could be worsening the underlying problem. Will discuss with my colleagues to see if we can agree upon an underlying pathology to guide treatment. -POD7 today, will update family this  afternoon. Given her poor exam, I am realistic that she will likely not recover, but I would prefer to have a diagnosis before we say that for sure. -SCDs/TEDs/SQH  Judith Part  02/21/19 7:33 AM

## 2019-02-21 NOTE — Progress Notes (Signed)
Subjective: No significant change.   Exam: Vitals:   02/21/19 0800 02/21/19 0830  BP: (!) 96/57 (!) 91/57  Pulse: 77 71  Resp: 15 14  Temp: 98.6 F (37 C)   SpO2: 96% 100%   Gen: In bed, intubated Resp: non-labored breathing, no acute distress Abd: soft, nt  Neuro: MS: Does not open eyes, but does respond to noxious stimulation with grimace, she does not follow commands CN: Pupils reactive bilaterally, with doll's eye maneuver, she moves across midline in both eyes in both directions.  Motor: She has extensor posturing in bilateral arms and legs.  Sensory: As above  Impression: 63 year old female with hemorrhage into the tumor bed after resection of a very large tumor.    Though seizure was certainly possible, given that she has sharp waves on EEG, seizure would not explain her current exam.   The sharps on EEG were intermittent interictal findings. I would not pursue treatment as these did not represent seizures, just an area of irritability.   The patchy areas of diffusion change do not seem consistent with embolic or typical small vessel disease. 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   Recommendations: 1) continue keppra 1g BID indefinitely.  2) please call if neurolgy can be of any further assistance.    Roland Rack, MD [facility] [phone]  If 7pm- 7am, please page neurology on call as listed in [place]. 02/21/2019  9:19 AM

## 2019-02-21 NOTE — Progress Notes (Signed)
NAME:  Donna Delgado, MRN:  [id number], DOB:  [birth year], LOS: 66 ADMISSION DATE:  01/20/2019, CONSULTATION DATE:  02/22/2019 REFERRING MD:  Neurosurgery, CHIEF COMPLAINT:  weakness  Brief History   Donna Delgado is a 63 year old woman with no reported PMH, presented on 6/28 with subacute on chronic R arm weakness, found to have a 3.4 cm  frontoparietal enhancing mass with compression and vasogenic edema.  She was taken to OR for tumor exision 7/2 complicated by post op hematoma, taken back for evacuation same day.   History of present illness   Other sx included R foot weakness, falls.  Metastatic work-up with CT chest, abdomen, and pelvis to identify a possible primary malignancy.  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  Started on decadron on 6/29 7/2 OR Tumor resection with 1200 EBL  Post operatively became more obtunded, CTH - hematoma at tumor resection bed.   Repeat crani for evacuation, 300EBL.   Non responsive post operatively, non reactive pupils.  NSG reviewed repeat scan following second surgery --  "significant surrounding edema which I suspect is a combination of cytotoxic and vasogenic edema. I do not see a need for repeat surgical evacuation as the actual hematoma appears to occupy the resection cavity. Will start her on 1cc/kg hypertonic saline and cont dex 10q6"   Past Medical History  No Known hx.  Emphysema on CT chest [facility] Events   Neurosurgery 7/2   Consults:  PCCM 7/2  Procedures:  7/2 brain tumor excision 7/2 hematoma evacuation  Significant Diagnostic Tests:  7/2 CT Head- 1. Regressed intracranial mass effect - including restored basilar cistern patency - following evacuation of postoperative hematoma. 2. Residual gas and blood in the resection cavity and residual rightward midline  shift of 12-14 mm (previously 18 mm). 3. Stable mild asymmetric enlargement of the right lateral ventricle.   7/3 MRI >>Patient has had left frontoparietal craniotomy for tumor resection and subsequently for hematoma evacuation. Residual hematoma on the order of 5.5 cm in diameter. This study does not show any tissue that enhances additionally after today's gadolinium administration. T1 bright tissue in the region of surgery prior to contrast administration is felt to represent blood products.   Mass effect with left-to-right shift of 11 mm, reduced from most recent head CT where it measured 13 mm.  Plan for MRI 02/20/2019>> no visible change in operative bed region left frontal parietal brain.  Vasogenic edema is is noted.  Possible perioperative infarction.  Mass-effect with left to right shift 11 mm.  Questionable vasogenic event.   Micro Data:  MRSA surveillance negative SARSCoV2 negative   Antimicrobials:  Cefazolin SCIP 7/2  Interim history/subjective:  No significant change in neurological status  Objective   Blood pressure (!) 91/57, pulse 71, temperature 98.6 F (37 C), temperature source Axillary, resp. rate 14, height 5\' 3"  (1.6 m), weight 52.8 kg, SpO2 100 %.    Vent Mode: PSV;CPAP FiO2 (%):  [30 %] 30 % Set Rate:  [16 bmp] 16 bmp Vt Set:  [410 mL] 410 mL PEEP:  [5 cmH20] 5 cmH20 Pressure Support:  [10 cmH20] 10 cmH20 Plateau Pressure:  [11 cmH20-13 cmH20] 13 cmH20   Intake/Output Summary (Last 24 hours) at 02/21/2019 0849 Last data filed at 02/21/2019 0800 Gross per 24 hour  Intake 1983.91 ml  Output 1350 ml  Net  633.91 ml   Filed Weights   02/17/19 0500 02/18/19 0500 02/20/19 0500  Weight: 51 kg 53.1 kg 52.8 kg    Examination: General: 76-year-old female remains intubated with no significant change in neurological status HEENT: Endotracheal tube gastric tube in place Neuro: Extension right side.  No follows commands CV: s1s2 rrr, no m/r/g PULM:  even/non-labored, lungs bilaterally diminished in the bases PP:IRJJ, non-tender, bsx4 active  Extremities: warm/dry, negative edema  Skin: no rashes or lesions    02/21/2019 chest x-ray support apparatus in appropriate position no acute abnormalities. Resolved Hospital Problem list   N/A  Assessment & Plan:  LT frontotemporal brain mass, postop hematoma. Acute encephalopathy-due to edema/shift Recent Labs  Lab 02/20/19 1832 02/21/19 0000 02/21/19 0517  NA 153* 148* 148*    Decadron has been completed Continue to monitor intensive care unit  Acute post operative respiratory failure 2/2 above She tolerates pressure support ventilation extended periods.  Her mental status precludes any extubation at this time.  Most likely will need tracheostomy and PEG in future.  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  Appears to have extremely poor prognosis.  Not a candidate for extubation will need tracheostomy and PEG to continue.    Best practice:  Diet: NPO. TF per recs today   Pain/Anxiety/Delirium protocol (if indicated):  prn analgesia VAP protocol (if indicated): yes DVT prophylaxis: SCDs. No lovenox d/t bleeding GI prophylaxis: PPI Glucose control: SSI Mobility: bed rest  Code Status: Full Family Communication: Per neurosurgery Disposition: ICU  App cct 30 min  Richardson Landry  ACNP Maryanna Shape PCCM Pager [phone] till 1 pm If no answer page [phone] 02/21/2019, 8:49 AM

## 2019-02-22 ENCOUNTER — Inpatient Hospital Stay (HOSPITAL_COMMUNITY): Payer: BC Managed Care – PPO

## 2019-02-22 DIAGNOSIS — G9389 Other specified disorders of brain: Secondary | ICD-10-CM | POA: Diagnosis not present

## 2019-02-22 DIAGNOSIS — J9601 Acute respiratory failure with hypoxia: Secondary | ICD-10-CM | POA: Diagnosis not present

## 2019-02-22 LAB — CBC WITH DIFFERENTIAL/PLATELET
Abs Immature Granulocytes: 0.1 10*3/uL — ABNORMAL HIGH (ref 0.00–0.07)
Basophils Absolute: 0 10*3/uL (ref 0.0–0.1)
Basophils Relative: 0 %
Eosinophils Absolute: 0 10*3/uL (ref 0.0–0.5)
Eosinophils Relative: 0 %
HCT: 29.3 % — ABNORMAL LOW (ref 36.0–46.0)
Hemoglobin: 9.2 g/dL — ABNORMAL LOW (ref 12.0–15.0)
Immature Granulocytes: 1 %
Lymphocytes Relative: 6 %
Lymphs Abs: 1.1 10*3/uL (ref 0.7–4.0)
MCH: 31.6 pg (ref 26.0–34.0)
MCHC: 31.4 g/dL (ref 30.0–36.0)
MCV: 100.7 fL — ABNORMAL HIGH (ref 80.0–100.0)
Monocytes Absolute: 0.9 10*3/uL (ref 0.1–1.0)
Monocytes Relative: 5 %
Neutro Abs: 15.3 10*3/uL — ABNORMAL HIGH (ref 1.7–7.7)
Neutrophils Relative %: 88 %
Platelets: 281 10*3/uL (ref 150–400)
RBC: 2.91 MIL/uL — ABNORMAL LOW (ref 3.87–5.11)
RDW: 17.8 % — ABNORMAL HIGH (ref 11.5–15.5)
WBC: 17.4 10*3/uL — ABNORMAL HIGH (ref 4.0–10.5)
nRBC: 0 % (ref 0.0–0.2)

## 2019-02-22 LAB — BASIC METABOLIC PANEL
Anion gap: 10 (ref 5–15)
BUN: 21 mg/dL (ref 8–23)
CO2: 24 mmol/L (ref 22–32)
Calcium: 8.4 mg/dL — ABNORMAL LOW (ref 8.9–10.3)
Chloride: 116 mmol/L — ABNORMAL HIGH (ref 98–111)
Creatinine, Ser: 0.6 mg/dL (ref 0.44–1.00)
GFR calc Af Amer: >60 mL/min (ref >60)
GFR calc non Af Amer: >60 mL/min (ref >60)
Glucose, Bld: 132 mg/dL — ABNORMAL HIGH (ref 70–99)
Potassium: 3.3 mmol/L — ABNORMAL LOW (ref 3.5–5.1)
Sodium: 150 mmol/L — ABNORMAL HIGH (ref 135–145)

## 2019-02-22 LAB — URINALYSIS, ROUTINE W REFLEX MICROSCOPIC
Bilirubin Urine: NEGATIVE
Glucose, UA: NEGATIVE mg/dL
Hgb urine dipstick: NEGATIVE
Ketones, ur: 5 mg/dL — AB
Nitrite: NEGATIVE
Protein, ur: NEGATIVE mg/dL
Specific Gravity, Urine: 1.03 (ref 1.005–1.030)
pH: 6 (ref 5.0–8.0)

## 2019-02-22 LAB — GLUCOSE, CAPILLARY
Glucose-Capillary: 121 mg/dL — ABNORMAL HIGH (ref 70–99)
Glucose-Capillary: 140 mg/dL — ABNORMAL HIGH (ref 70–99)
Glucose-Capillary: 101 mg/dL — ABNORMAL HIGH (ref 70–99)
Glucose-Capillary: 113 mg/dL — ABNORMAL HIGH (ref 70–99)

## 2019-02-22 LAB — SODIUM: Sodium: 150 mmol/L — ABNORMAL HIGH (ref 135–145)

## 2019-02-22 LAB — PHOSPHORUS: Phosphorus: 2.5 mg/dL (ref 2.5–4.6)

## 2019-02-22 LAB — MAGNESIUM: Magnesium: 2.2 mg/dL (ref 1.7–2.4)

## 2019-02-22 MED ORDER — ACETAMINOPHEN 650 MG RE SUPP: 650.0000 mg | Freq: Four times a day (QID) | RECTAL | Status: DC | PRN

## 2019-02-22 MED ORDER — ACETAMINOPHEN 325 MG PO TABS: 650.0000 mg | ORAL_TABLET | Freq: Four times a day (QID) | ORAL | Status: DC | PRN

## 2019-02-22 MED ORDER — VANCOMYCIN HCL IN DEXTROSE 1-5 GM/200ML-% IV SOLN: 1000.0000 mg | Freq: Two times a day (BID) | INTRAVENOUS | Status: DC

## 2019-02-22 MED ORDER — POLYVINYL ALCOHOL 1.4 % OP SOLN
1.0000 [drp] | Freq: Four times a day (QID) | OPHTHALMIC | Status: DC | PRN
  Filled 2019-02-22: qty 15

## 2019-02-22 MED ORDER — GLYCOPYRROLATE 0.2 MG/ML IJ SOLN
0.2000 mg | INTRAMUSCULAR | Status: DC | PRN
  Administered 2019-02-22: 21:00:00 0.2 mg via INTRAVENOUS
  Filled 2019-02-22: qty 1

## 2019-02-22 MED ORDER — VITAL AF 1.2 CAL PO LIQD: 1000.0000 mL | ORAL | Status: DC

## 2019-02-22 MED ORDER — DEXTROSE 5 % IV SOLN: INTRAVENOUS | Status: DC

## 2019-02-22 MED ORDER — GLYCOPYRROLATE 0.2 MG/ML IJ SOLN: 0.2000 mg | INTRAMUSCULAR | Status: DC | PRN

## 2019-02-22 MED ORDER — VANCOMYCIN HCL IN DEXTROSE 1-5 GM/200ML-% IV SOLN: 1000.0000 mg | INTRAVENOUS | Status: DC

## 2019-02-22 MED ORDER — SODIUM CHLORIDE 0.9 % IV SOLN: 1.0000 g | Freq: Two times a day (BID) | INTRAVENOUS | Status: DC

## 2019-02-22 MED ORDER — GLYCOPYRROLATE 1 MG PO TABS
1.0000 mg | ORAL_TABLET | ORAL | Status: DC | PRN
  Filled 2019-02-22: qty 1

## 2019-02-22 MED ORDER — DIPHENHYDRAMINE HCL 50 MG/ML IJ SOLN: 25.0000 mg | INTRAMUSCULAR | Status: DC | PRN

## 2019-02-22 MED ORDER — SODIUM CHLORIDE 0.9 % IV SOLN
2.0000 g | Freq: Three times a day (TID) | INTRAVENOUS | Status: DC
  Administered 2019-02-22: 2 g via INTRAVENOUS
  Filled 2019-02-22 (×3): qty 2

## 2019-02-22 MED ORDER — VANCOMYCIN HCL 10 G IV SOLR
1250.0000 mg | Freq: Once | INTRAVENOUS | Status: AC
  Administered 2019-02-22: 14:00:00 1250 mg via INTRAVENOUS
  Filled 2019-02-22: qty 1250

## 2019-02-22 MED ORDER — MORPHINE SULFATE (PF) 2 MG/ML IV SOLN: 2.0000 mg | INTRAVENOUS | Status: DC | PRN

## 2019-02-22 NOTE — Progress Notes (Signed)
2000 vent check not done at this time. A lot of family members at the bedside at this time. Patient is stable at this time.

## 2019-02-22 NOTE — Progress Notes (Addendum)
Nutrition Follow-up  DOCUMENTATION CODES:   Underweight, Severe malnutrition in context of chronic illness  INTERVENTION:   Increase  Vital AF 1.2 to 50 ml/hr  Provides: 1440 kcal, 90 grams protein, and 973 ml free water.   NUTRITION DIAGNOSIS:   Severe Malnutrition related to chronic illness(smoker, possible cancer) as evidenced by severe fat depletion, severe muscle depletion. Ongoing.  GOAL:   Patient will meet greater than or equal to 90% of their needs Met.   MONITOR:   TF tolerance, Vent status  REASON FOR ASSESSMENT:   Consult, Ventilator Enteral/tube feeding initiation and management  ASSESSMENT:   Pt with no PMH (smoker and emphysema noted on CT) admitted on 6/28 with subacute on chronic R arm weakness found to have a 3.4 cm frontoparietal mass. 7/2 s/p tumor excision complicated by post op hematoma s/p evacuation.   Pt discussed during ICU rounds and with RN. Palliative care consulted Per neuro poor prognosis  Patient is currently intubated on ventilator support MV: 16.1 L/min Temp (24hrs), Avg:99 F (37.2 C), Min:98.5 F (36.9 C), Max:100.2 F (37.9 C)  Medications reviewed  Labs reviewed: Na 150 (H), K 3.3 (L)     Diet Order:   Diet Order            Diet NPO time specified  Diet effective now              EDUCATION NEEDS:   No education needs have been identified at this time  Skin:  Skin Assessment: Reviewed RN Assessment  Last BM:  7/9  Height:   Ht Readings from Last 1 Encounters:  03/03/2019 '5\' 3"'  (1.6 m)    Weight:   Wt Readings from Last 1 Encounters:  02/22/19 53.1 kg    Ideal Body Weight:  52.2 kg  BMI:  Body mass index is 20.74 kg/m.  Estimated Nutritional Needs:   Kcal:  1400  Protein:  70-85 grams  Fluid:  > 1.5 L/day  Maylon Peppers RD, LDN, CNSC [phone] Pager [phone] After Hours Pager

## 2019-02-22 NOTE — Procedures (Signed)
History: 63 year old female with persistent encephalopathy in the setting of hemorrhagic tumor  Sedation: None  Technique: This is a 21 channel routine scalp EEG performed at the bedside with bipolar and monopolar montages arranged in accordance to the international 10/20 system of electrode placement. One channel was dedicated to EKG recording.    Background: The background is diffusely disorganized consisting of irregular delta and theta range activities.  At times, there does appear to be a possible posterior dominant rhythm of 7 to 8 Hz which is poorly sustained.  In addition, there are occasional sharp waves at P3 >C3.  With sleep there is attenuation of the diffuse slow activity with return on patient stimulation.  Photic stimulation: Physiologic driving is not performed  EEG Abnormalities: 1) left parieto-central sharp waves  Clinical Interpretation: This EEG is consistent with an area of epileptogenic potential in the left posterior region).  There is also evidence of diffuse nonspecific cerebral dysfunction (encephalopathy).  No seizure was recorded.  Roland Rack, MD [facility] [phone]  If 7pm- 7am, please page neurology on call as listed in [place].

## 2019-02-22 NOTE — Progress Notes (Signed)
Extubation was refused per family request.

## 2019-02-22 NOTE — Progress Notes (Signed)
Neurosurgery Service Progress Note  Subjective: No acute events overnight  Objective: Vitals:   02/22/19 0400 02/22/19 0500 02/22/19 0600 02/22/19 0800  BP: 133/64 (!) 101/58 105/60   Pulse: 91 73 72 87  Resp: 20 17 16 (!) 22  Temp: 98.7 F (37.1 C)   99.4 F (37.4 C)  TempSrc: Oral   Axillary  SpO2: 100% 100% 100%   Weight:  53.1 kg    Height:       Temp (24hrs), Avg:98.8 F (37.1 C), Min:98.5 F (36.9 C), Max:99.4 F (37.4 C)  CBC Latest Ref Rng & Units 02/22/2019 02/20/2019 02/20/2019  WBC 4.0 - 10.5 K/uL 17.4(H) 15.3(H) 11.2(H)  Hemoglobin 12.0 - 15.0 g/dL 9.2(L) 9.7(L) 6.7(LL)  Hematocrit 36.0 - 46.0 % 29.3(L) 30.4(L) 22.2(L)  Platelets 150 - 400 K/uL 281 267 225   BMP Latest Ref Rng & Units 02/22/2019 02/22/2019 02/21/2019  Glucose 70 - 99 mg/dL 132(H) - -  BUN 8 - 23 mg/dL 21 - -  Creatinine 0.44 - 1.00 mg/dL 0.60 - -  Sodium 135 - 145 mmol/L 150(H) 150(H) 148(H)  Potassium 3.5 - 5.1 mmol/L 3.3(L) - -  Chloride 98 - 111 mmol/L 116(H) - -  CO2 22 - 32 mmol/L 24 - -  Calcium 8.9 - 10.3 mg/dL 8.4(L) - -    Intake/Output Summary (Last 24 hours) at 02/22/2019 0835 Last data filed at 02/22/2019 0600 Gross per 24 hour  Intake 1135.92 ml  Output 800 ml  Net 335.92 ml    Current Facility-Administered Medications:  .  0.9 %  sodium chloride infusion, , Intravenous, Continuous, Ostergard, Thomas A, MD, Stopped at 02/21/19 1549 .  acetaminophen (TYLENOL) tablet 650 mg, 650 mg, Oral, Q4H PRN **OR** acetaminophen (TYLENOL) suppository 650 mg, 650 mg, Rectal, Q4H PRN, Ostergard, Thomas A, MD .  chlorhexidine gluconate (MEDLINE KIT) (PERIDEX) 0.12 % solution 15 mL, 15 mL, Mouth Rinse, BID, Deterding, Elizabeth C, MD, 15 mL at 02/22/19 0758 .  Chlorhexidine Gluconate Cloth 2 % PADS 6 each, 6 each, Topical, Daily, Ostergard, Thomas A, MD, 6 each at 02/22/19 0109 .  Chlorhexidine Gluconate Cloth 2 % PADS 6 each, 6 each, Topical, Daily, Ostergard, Thomas A, MD, 6 each at 02/22/19  0109 .  feeding supplement (VITAL AF 1.2 CAL) liquid 1,000 mL, 1,000 mL, Per Tube, Continuous, Ostergard, Thomas A, MD, Last Rate: 45 mL/hr at 02/21/19 0954, 1,000 mL at 02/21/19 0954 .  fentaNYL (SUBLIMAZE) injection 25-100 mcg, 25-100 mcg, Intravenous, Q2H PRN, Alva, Rakesh V, MD, 100 mcg at 02/21/19 1335 .  heparin injection 5,000 Units, 5,000 Units, Subcutaneous, Q8H, Ostergard, Thomas A, MD, 5,000 Units at 02/22/19 0553 .  hydrALAZINE (APRESOLINE) injection 10 mg, 10 mg, Intravenous, Q1H PRN, Ostergard, Thomas A, MD, 10 mg at 02/21/19 0745 .  HYDROmorphone (DILAUDID) injection 0.5 mg, 0.5 mg, Intravenous, Q3H PRN, Ostergard, Thomas A, MD, 0.5 mg at 02/18/19 0344 .  insulin aspart (novoLOG) injection 0-9 Units, 0-9 Units, Subcutaneous, Q4H, Deterding, Elizabeth C, MD, 1 Units at 02/22/19 0758 .  labetalol (NORMODYNE) injection 10-40 mg, 10-40 mg, Intravenous, Q10 min PRN, Ostergard, Thomas A, MD, 10 mg at 02/15/19 0207 .  levETIRAcetam (KEPPRA) IVPB 1000 mg/100 mL premix, 1,000 mg, Intravenous, Q12H, Ostergard, Thomas A, MD, Last Rate: 400 mL/hr at 02/22/19 0600 .  LORazepam (ATIVAN) injection 2 mg, 2 mg, Intravenous, Q2H PRN, Ostergard, Thomas A, MD, 2 mg at 02/19/19 2300 .  MEDLINE mouth rinse, 15 mL, Mouth Rinse, 10 times per day, Deterding,   Guadelupe Sabin, MD, 15 mL at 02/22/19 0533 .  ondansetron (ZOFRAN) tablet 4 mg, 4 mg, Oral, Q4H PRN **OR** ondansetron (ZOFRAN) injection 4 mg, 4 mg, Intravenous, Q4H PRN, Marris Frontera, Joyice Faster, MD .  oxyCODONE (Oxy IR/ROXICODONE) immediate release tablet 10 mg, 10 mg, Oral, Q4H PRN, Judith Part, MD .  oxyCODONE (Oxy IR/ROXICODONE) immediate release tablet 5 mg, 5 mg, Oral, Q4H PRN, Judith Part, MD .  pantoprazole sodium (PROTONIX) 40 mg/20 mL oral suspension 40 mg, 40 mg, Per Tube, Daily, Judith Part, MD, 40 mg at 02/21/19 0955 .  promethazine (PHENERGAN) tablet 12.5-25 mg, 12.5-25 mg, Oral, Q4H PRN, Eriverto Byrnes, Joyice Faster, MD .   senna-docusate (Senokot-S) tablet 1 tablet, 1 tablet, Oral, QHS PRN, Aleksis Jiggetts A, MD .  sodium chloride flush (NS) 0.9 % injection 10-40 mL, 10-40 mL, Intracatheter, Q12H, Ravenna Legore, Joyice Faster, MD, 10 mL at 02/21/19 2210 .  sodium chloride flush (NS) 0.9 % injection 10-40 mL, 10-40 mL, Intracatheter, PRN, Judith Part, MD   Physical Exam: Intubated, no sedation, lifts eyelids but doesn't open eyes to stim, PERRL, extending spontaneously in RUE / BLE, extension in LUE Incision c/d/i  Assessment and Plan: 63 y.o. 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 7/2 s/p craniotomy for tumor resection, post-op normal recovery from ETGA then progressive loss of exam, CTH w/ large post-op hematoma s/p emergent clot evac. 7/3 post-op CTH improved, 7/4 MRI with GTR, improved midline shift, 7/5 rpt CTH with continued decrease in midline shift, 7/9 rpt MRI with diffuse diffusion changes including bilateral thalamus, likely sequelae of herniation from expansion of hematoma  -POD8 today, I tried to update family last night but missed them. After discussion with colleagues, I agree with the consensus that these diffusion changes are likely from her herniation event immediately post-op when the hematoma was expanding. We took her to the OR immediately but unfortunately there was clearly some damage. -in my opinion, best-case scenario is that she is able to regain consciousness with time. This would leave her hemiplegic and aphasic with a new diagnosis of metastatic lung cancer, which I think is pretty grim. I am going to discuss this with the daughter along with goals of care.  -SCDs/TEDs/SQH  Judith Part  02/22/19 8:35 AM

## 2019-02-22 NOTE — Progress Notes (Signed)
Pharmacy Antibiotic Note  Donna Delgado is a 63 y.o. female admitted on 01/15/2019 with suspected infection to start empiric coverage.  Pharmacy has been consulted for Cefepime and Vancomycin dosing.  Worsening leukocytosis. Tmax 100.2. SCr stable (CrCl ~60.3 mL/min). Good urine output. Cultures pending.  Plan: Cefepime 2 grams IV every 8 hours.  Vancomycin 1250mg  IV x1 then 1000 mg IV Q 24 hrs. Goal AUC 400-550. Expected AUC: 480 SCr used: 0.8 Monitor renal function, culture results, and clinical status.  Vancomycin levels as needed.    Height: 5\' 3"  (160 cm) Weight: 117 lb 1 oz (53.1 kg) IBW/kg (Calculated) : 52.4  Temp (24hrs), Avg:99 F (37.2 C), Min:98.5 F (36.9 C), Max:100.2 F (37.9 C)  Recent Labs  Lab 02/18/19 0551 02/19/19 0540 02/20/19 0550 02/20/19 1250 02/21/19 0517 02/22/19 0540  WBC 10.1 12.8* 11.2* 15.3*  --  17.4*  CREATININE 0.53 0.54 0.61  --  0.44 0.60    Estimated Creatinine Clearance: 60.3 mL/min (by C-G formula based on SCr of 0.6 mg/dL).    No Known Allergies  Antimicrobials this admission: Cefepime 7/10 >> Vancomycin 7/10 >>  Dose adjustments this admission:  Microbiology results: 7/10 BCx:  7/10 Sputum:  7/2 MRSA PCR, Covid: negative  Thank you for allowing pharmacy to be a part of this patient's care.  Sloan Leiter, PharmD, BCPS, BCCCP Clinical Pharmacist Please refer to [facility] for [place] numbers 02/22/2019 12:13 PM

## 2019-02-22 NOTE — Progress Notes (Signed)
EEG complete - results pending 

## 2019-02-22 NOTE — Progress Notes (Signed)
Orthopedic Tech Progress Note Patient Details:  Donna Delgado March 11, 1956 [id number]  Patient ID: [name], female   DOB: Jan 24, 1956, 63 y.o.   MRN: [id number]   [name] 02/22/2019, 10:14 AMCalled [employer] for foot ankle orthosis.

## 2019-02-22 NOTE — Progress Notes (Signed)
NAME:  Donna Delgado, MRN:  [id number], DOB:  04-Sep-1955, LOS: 12 ADMISSION DATE:  01/31/2019, CONSULTATION DATE:  03/07/2019 REFERRING MD:  Neurosurgery, CHIEF COMPLAINT:  weakness  Brief History   Donna Delgado is a 63 year old woman with no reported PMH, presented on 6/28 with subacute on chronic R arm weakness, found to have a 3.4 cm  frontoparietal enhancing mass with compression and vasogenic edema.  She was taken to OR for tumor exision 7/2 complicated by post op hematoma, taken back for evacuation same day.   History of present illness   Other sx included R foot weakness, falls.  Metastatic work-up with CT chest, abdomen, and pelvis to identify a possible primary malignancy.  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  Started on decadron on 6/29 7/2 OR Tumor resection with 1200 EBL  Post operatively became more obtunded, CTH - hematoma at tumor resection bed.   Repeat crani for evacuation, 300EBL.   Non responsive post operatively, non reactive pupils.  NSG reviewed repeat scan following second surgery --  "significant surrounding edema which I suspect is a combination of cytotoxic and vasogenic edema. I do not see a need for repeat surgical evacuation as the actual hematoma appears to occupy the resection cavity. Will start her on 1cc/kg hypertonic saline and cont dex 10q6"   Past Medical History  No Known hx.  Emphysema on CT chest [facility]   Neurosurgery 7/2   Consults:  PCCM 7/2  Procedures:  7/2 brain tumor excision 7/2 hematoma evacuation  Significant Diagnostic Tests:  7/2 CT Head- 1. Regressed intracranial mass effect - including restored basilar cistern patency - following evacuation of postoperative hematoma. 2. Residual gas and blood in the resection cavity and residual rightward midline  shift of 12-14 mm (previously 18 mm). 3. Stable mild asymmetric enlargement of the right lateral ventricle.   7/3 MRI >>Patient has had left frontoparietal craniotomy for tumor resection and subsequently for hematoma evacuation. Residual hematoma on the order of 5.5 cm in diameter. This study does not show any tissue that enhances additionally after today's gadolinium administration. T1 bright tissue in the region of surgery prior to contrast administration is felt to represent blood products.   Mass effect with left-to-right shift of 11 mm, reduced from most recent head CT where it measured 13 mm.  Plan for MRI 02/20/2019>> no visible change in operative bed region left frontal parietal brain.  Vasogenic edema is is noted.  Possible perioperative infarction.  Mass-effect with left to right shift 11 mm.  Questionable vasogenic event.   Micro Data:  MRSA surveillance negative SARSCoV2 negative   Antimicrobials:  Cefazolin SCIP 7/2  Interim history/subjective:  No significant change in neurological status.  Prognosis remains grim.  Objective   Blood pressure 105/60, pulse 87, temperature 99.4 F (37.4 C), temperature source Axillary, resp. rate (!) 22, height 5\' 3"  (1.6 m), weight 53.1 kg, SpO2 100 %.    Vent Mode: PSV;CPAP FiO2 (%):  [30 %] 30 % Set Rate:  [16 bmp] 16 bmp Vt Set:  [410 mL] 410 mL PEEP:  [5 cmH20] 5 cmH20 Pressure Support:  [5 cmH20-10 cmH20] 5 cmH20 Plateau Pressure:  [14 cmH20-16 cmH20] 14 cmH20   Intake/Output Summary (Last 24 hours) at 02/22/2019 0855 Last data filed at 02/22/2019 0600 Gross per 24 hour  Intake 1135.92 ml  Output 800 ml  Net 335.92 ml   Filed Weights   02/18/19 0500 02/20/19 0500 02/22/19 0500  Weight: 53.1 kg 52.8 kg 53.1 kg    Examination: General: 63 year old female no significant change in neurological status HEENT: Downward gaze preference, left craniotomy incision well approximated Neuro: Extends to stimulus CV: Heart  sounds are regular PULM: even/non-labored, lungs bilaterally diminished in the bases XE:NMMH, non-tender, bsx4 active  Extremities: warm/dry, negative edema, foot drop noted Skin: no rashes or lesions     02/21/2019 chest x-ray support apparatus in appropriate position no acute abnormalities. Resolved Hospital Problem list   N/A  Assessment & Plan:  LT frontotemporal brain mass, postop hematoma. Acute encephalopathy-due to edema/shift Suspected herniation Recent Labs  Lab 02/21/19 1800 02/22/19 0000 02/22/19 0540  NA 148* 150* 150*    Sodium 150 Continue to monitor   Acute post operative respiratory failure 2/2 above She tolerates pressure support ventilation but is on extubated will secondary to mental status. Questionable pursue tracheostomy and PEG placement versus comfort care.  Acute blood loss anemia -globin continues to drop Recent Labs    02/20/19 1250 02/22/19 0540  HGB 9.7* 9.2*   Transfuse per protocol  Intermittent Hypertension PRN Apresoline 02/22/2019 current blood pressure is 144/70  Hyperglycemia: Due to dexamethasone CBG (last 3)  Recent Labs    02/21/19 2311 02/22/19 0315 02/22/19 0721  GLUCAP 115* 121* 140*    Sliding scale insulin      Summary: She is status post left craniotomy for evacuation of hematoma following removal of brain mass with adenocarcinoma.  This is most likely secondary to metastatic lung cancer.  She is remained severely neurologically injured most likely from herniation prior to having clot removed.  Neurosurgery is having ongoing conversations with family.    Best practice:  Diet: NPO. TF per recs today   Pain/Anxiety/Delirium protocol (if indicated):  prn analgesia VAP protocol (if indicated): yes DVT prophylaxis: SCDs. No lovenox d/t bleeding GI prophylaxis: PPI Glucose control: SSI Mobility: bed rest  Code Status: Full Family Communication: Per neurosurgery.  Per neurosurgery's note prognosis is grim.   Ongoing discussion with family concerning poor prognosis and little of no chance of any meaningful recovery. Disposition: ICU  App cct 30 min  Richardson Landry Minor ACNP Maryanna Shape PCCM Pager [phone] till 1 pm If no answer page [phone] 02/22/2019, 8:55 AM

## 2019-02-23 ENCOUNTER — Inpatient Hospital Stay (HOSPITAL_COMMUNITY): Payer: BC Managed Care – PPO

## 2019-02-23 DIAGNOSIS — J9601 Acute respiratory failure with hypoxia: Secondary | ICD-10-CM | POA: Diagnosis not present

## 2019-02-23 DIAGNOSIS — Z9889 Other specified postprocedural states: Secondary | ICD-10-CM | POA: Diagnosis not present

## 2019-02-23 DIAGNOSIS — J9600 Acute respiratory failure, unspecified whether with hypoxia or hypercapnia: Secondary | ICD-10-CM

## 2019-02-23 DIAGNOSIS — Z7189 Other specified counseling: Secondary | ICD-10-CM

## 2019-02-23 DIAGNOSIS — Z515 Encounter for palliative care: Secondary | ICD-10-CM

## 2019-02-23 DIAGNOSIS — G9340 Encephalopathy, unspecified: Secondary | ICD-10-CM | POA: Diagnosis not present

## 2019-02-23 DIAGNOSIS — G9389 Other specified disorders of brain: Secondary | ICD-10-CM | POA: Diagnosis not present

## 2019-02-23 LAB — URINE CULTURE: Culture: <10000 — AB

## 2019-02-23 LAB — BASIC METABOLIC PANEL
Anion gap: 12 (ref 5–15)
BUN: 22 mg/dL (ref 8–23)
CO2: 22 mmol/L (ref 22–32)
Calcium: 8.3 mg/dL — ABNORMAL LOW (ref 8.9–10.3)
Chloride: 119 mmol/L — ABNORMAL HIGH (ref 98–111)
Creatinine, Ser: 0.7 mg/dL (ref 0.44–1.00)
GFR calc Af Amer: >60 mL/min (ref >60)
GFR calc non Af Amer: >60 mL/min (ref >60)
Glucose, Bld: 83 mg/dL (ref 70–99)
Potassium: 3.8 mmol/L (ref 3.5–5.1)
Sodium: 153 mmol/L — ABNORMAL HIGH (ref 135–145)

## 2019-02-23 LAB — CBC WITH DIFFERENTIAL/PLATELET
Abs Immature Granulocytes: 0.13 10*3/uL — ABNORMAL HIGH (ref 0.00–0.07)
Basophils Absolute: 0 10*3/uL (ref 0.0–0.1)
Basophils Relative: 0 %
Eosinophils Absolute: 0.1 10*3/uL (ref 0.0–0.5)
Eosinophils Relative: 1 %
HCT: 32.9 % — ABNORMAL LOW (ref 36.0–46.0)
Hemoglobin: 9.9 g/dL — ABNORMAL LOW (ref 12.0–15.0)
Immature Granulocytes: 1 %
Lymphocytes Relative: 8 %
Lymphs Abs: 1.4 10*3/uL (ref 0.7–4.0)
MCH: 32 pg (ref 26.0–34.0)
MCHC: 30.1 g/dL (ref 30.0–36.0)
MCV: 106.5 fL — ABNORMAL HIGH (ref 80.0–100.0)
Monocytes Absolute: 1.2 10*3/uL — ABNORMAL HIGH (ref 0.1–1.0)
Monocytes Relative: 7 %
Neutro Abs: 13.9 10*3/uL — ABNORMAL HIGH (ref 1.7–7.7)
Neutrophils Relative %: 83 %
Platelets: 339 10*3/uL (ref 150–400)
RBC: 3.09 MIL/uL — ABNORMAL LOW (ref 3.87–5.11)
RDW: 17.2 % — ABNORMAL HIGH (ref 11.5–15.5)
WBC: 16.8 10*3/uL — ABNORMAL HIGH (ref 4.0–10.5)
nRBC: 0 % (ref 0.0–0.2)

## 2019-02-23 LAB — GLUCOSE, CAPILLARY
Glucose-Capillary: 96 mg/dL (ref 70–99)
Glucose-Capillary: 82 mg/dL (ref 70–99)
Glucose-Capillary: 92 mg/dL (ref 70–99)
Glucose-Capillary: 89 mg/dL (ref 70–99)
Glucose-Capillary: 75 mg/dL (ref 70–99)
Glucose-Capillary: 91 mg/dL (ref 70–99)

## 2019-02-23 LAB — PHOSPHORUS: Phosphorus: 2.6 mg/dL (ref 2.5–4.6)

## 2019-02-23 LAB — MAGNESIUM: Magnesium: 2.3 mg/dL (ref 1.7–2.4)

## 2019-02-23 MED ORDER — SODIUM CHLORIDE 0.9 % IV SOLN
2.0000 g | Freq: Three times a day (TID) | INTRAVENOUS | Status: DC
  Administered 2019-02-23 – 2019-02-24 (×3): 2 g via INTRAVENOUS
  Filled 2019-02-23 (×7): qty 2

## 2019-02-23 NOTE — Progress Notes (Signed)
Family requested another night to think about pt status before proceeding with comfort care. Family plans on being here at 10 am to discuss decision.

## 2019-02-23 NOTE — Consult Note (Signed)
Consultation Note Date: 02/23/2019   Patient Name: Donna Delgado  DOB: [birth year]  MRN: [id number]  Age / Sex: 63 y.o., female  PCP: Patient, No Pcp Per Referring Physician: Judith Part, MD  Reason for Consultation: Establishing goals of care, Psychosocial/spiritual support and Withdrawal of life-sustaining treatment  HPI/Patient Profile: 63 y.o. female no known past medical history admitted on 02/12/2019 with right arm weakness, right leg weakness.  Patient reported that she has been experiencing symptoms off and on for the past 4 months beginning with right hand weakness where she was dropping objects.  This progressed to right foot drop, falls, gait disturbance.  Had been home caring for her ill mother who had TIWPY-09 and subsequently died in 12-05-22.  Her father has dementia. Patient was found per CT to have a left frontotemporal mass with large area of associated edema and mass-effect.  MRI performed and showed 3.4 cm mass with a 4 mm shift as well as pulmonary nodules to right lung.  Neurosurgery was consulted.  Resection of frontoparietal tumor was done on 02/13/2019.  This was complicated by bleeding and required reoperation, hematoma evacuation.  Since surgery she has remained ventilator dependent with profound neurological deficits.  She is posturing and unresponsive  Consult ordered for goals of care in the setting of tumor resection with poor neurological recovery and metastatic disease.   Clinical Assessment and Goals of Care: Patient seen, chart reviewed.  Staffed with bedside RN, Donna Delgado as well as CCM, Dr. Lynetta Delgado.  Per chart review, neurosurgery as well as critical care medicine has been in discussion with patient's daughter and patient's father regarding one-way extubation, liberation from life support given her profound neurological deficits as well as in the setting of what now is likely  metastatic lung cancer.  Per neurosurgery, best case would be that patient awaken some but she would still be hemiplegic, aphasic and still with the underlying illness of lung cancer now with metastatic disease to the brain.  Introduced palliative medicine services as an additional resource and source of support with care directed at goals and improving quality of life for both patient and family  Family arrived to the unit on 02/22/2019 with the goal to go forward with liberation from the vent but decided at the last minute they needed more time to think through things.  When I spoke to patient's daughter Donna Delgado today she states she and her father are still trying to come to grips with all that the family has been through ;with the recent death of patient's mother and now likely, the patient  Patient is unable to make any decisions for herself at this point.  Her father is still living but has dementia per chart review.  She has a daughter, Donna Delgado at [phone].  Critical care medicine and neurosurgery has been in contact with both patient's father as well as daughter    SUMMARY OF RECOMMENDATIONS   Patient is a DNR She remains on ventilator with a very poor prognosis secondary to profound neurologic  disability in the setting of new metastatic lung cancer, with disease spread to the brain Further family meeting arranged for 02/24/2019 at 2 PM Code Status/Advance Care Planning:  DNR    Palliative Prophylaxis:   Aspiration, Bowel Regimen, Delirium Protocol, Eye Care, Frequent Pain Assessment, Oral Care and Turn Reposition    Psycho-social/Spiritual:   Desire for further Chaplaincy support:no   Prognosis:   If family proceeds with full comfort care, liberation from ventilator, life expectancy probably hours to days  Discharge Planning: To Be Determined      Primary Diagnoses: Present on Admission: . Brain tumor ([place])   I have reviewed the medical record, interviewed  the patient and family, and examined the patient. The following aspects are pertinent.  History reviewed. No pertinent past medical history. Social History   Socioeconomic History  . Marital status: Married    Spouse name: Not on file  . Number of children: Not on file  . Years of education: Not on file  . Highest education level: Not on file  Occupational History  . Not on file  Social Needs  . Financial resource strain: Not on file  . Food insecurity    Worry: Not on file    Inability: Not on file  . Transportation needs    Medical: Not on file    Non-medical: Not on file  Tobacco Use  . Smoking status: Current Every Day Smoker    Packs/day: 2.00  . Smokeless tobacco: Never Used  Substance and Sexual Activity  . Alcohol use: Yes    Alcohol/week: 3.0 - 4.0 standard drinks    Types: 3 - 4 Cans of beer per week  . Drug use: Not on file  . Sexual activity: Not on file  Lifestyle  . Physical activity    Days per week: Not on file    Minutes per session: Not on file  . Stress: Not on file  Relationships  . Social Herbalist on phone: Not on file    Gets together: Not on file    Attends religious service: Not on file    Active member of club or organization: Not on file    Attends meetings of clubs or organizations: Not on file    Relationship status: Not on file  Other Topics Concern  . Not on file  Social History Narrative  . Not on file   History reviewed. No pertinent family history. Scheduled Meds: . chlorhexidine gluconate (MEDLINE KIT)  15 mL Mouth Rinse BID  . Chlorhexidine Gluconate Cloth  6 each Topical Daily  . Chlorhexidine Gluconate Cloth  6 each Topical Daily  . heparin injection (subcutaneous)  5,000 Units Subcutaneous Q8H  . insulin aspart  0-9 Units Subcutaneous Q4H  . mouth rinse  15 mL Mouth Rinse 10 times per day  . pantoprazole sodium  40 mg Per Tube Daily  . sodium chloride flush  10-40 mL Intracatheter Q12H   Continuous  Infusions: . sodium chloride Stopped (02/21/19 1549)  . dextrose    . feeding supplement (VITAL AF 1.2 CAL)    . levETIRAcetam Stopped (02/23/19 0603)   PRN Meds:.acetaminophen **OR** acetaminophen, diphenhydrAMINE, fentaNYL (SUBLIMAZE) injection, glycopyrrolate **OR** glycopyrrolate **OR** glycopyrrolate, hydrALAZINE, labetalol, LORazepam, ondansetron **OR** ondansetron (ZOFRAN) IV, oxyCODONE, oxyCODONE, polyvinyl alcohol, promethazine, senna-docusate, sodium chloride flush Medications Prior to Admission:  Prior to Admission medications   Medication Sig Start Date End Date Taking? Authorizing Provider  acetaminophen (TYLENOL) 500 MG tablet Take 1,000 mg by mouth  every 6 (six) hours as needed for mild pain or headache.   Yes [provider]   No Known Allergies Review of Systems  Unable to perform ROS: Intubated    Physical Exam Vitals signs and nursing note reviewed.  Constitutional:      Appearance: She is ill-appearing.  HENT:     Head:     Comments: Staples to scalp, incision well approximated,  Cardiovascular:     Rate and Rhythm: Rhythm irregular.  Pulmonary:     Comments: Intubated, full ventilator support Neurological:     Comments: Patient is unresponsive, posturing She is unresponsive.  Not on any sedation  Psychiatric:     Comments: Unable to test     Vital Signs: BP (!) 144/83   Pulse 86   Temp 98.6 F (37 C) (Axillary)   Resp (!) 21   Ht '5\' 3"'  (1.6 m)   Wt 52.8 kg   SpO2 99%   BMI 20.62 kg/m  Pain Scale: CPOT   Pain Score: Asleep   SpO2: SpO2: 99 % O2 Device:SpO2: 99 % O2 Flow Rate: .   IO: Intake/output summary:   Intake/Output Summary (Last 24 hours) at 02/23/2019 1051 Last data filed at 02/23/2019 2683 Gross per 24 hour  Intake 522 ml  Output 950 ml  Net -428 ml    LBM: Last BM Date: 02/21/19 Baseline Weight: Weight: 45.4 kg Most recent weight: Weight: 52.8 kg     Palliative Assessment/Data:   Flowsheet Rows     Most  Recent Value  Intake Tab  Referral Department  Critical care  Unit at Time of Referral  ICU  Palliative Care Primary Diagnosis  Neurology  Date Notified  02/22/19  Reason for referral  Clarify Goals of Care, Psychosocial or Spiritual support, End of Life Care Assistance  Date of Admission  03/12/19  Date first seen by Palliative Care  02/23/19  # of days Palliative referral response time  1 Day(s)  # of days IP prior to Palliative referral  -18  Clinical Assessment  Palliative Performance Scale Score  20%  Pain Max last 24 hours  Not able to report  Pain Min Last 24 hours  Not able to report  Dyspnea Max Last 24 Hours  Not able to report  Dyspnea Min Last 24 hours  Not able to report  Nausea Max Last 24 Hours  Not able to report  Nausea Min Last 24 Hours  Not able to report  Anxiety Max Last 24 Hours  Not able to report  Psychosocial & Spiritual Assessment  Palliative Care Outcomes  Patient/Family meeting held?  Yes  Who was at the meeting?  spoke to dtr via phone      Time In: 0900 Time Out: 0940 Time Total: 40 min Greater than 50%  of this time was spent counseling and coordinating care related to the above assessment and plan.  Signed by: Dory Horn, NP   Please contact Palliative Medicine Team phone at [phone] for questions and concerns.  For individual provider: See Shea Evans

## 2019-02-23 NOTE — Progress Notes (Signed)
NAME:  Donna Delgado, MRN:  [id number], DOB:  20-Jan-1956, LOS: 28 ADMISSION DATE:  01/26/2019, CONSULTATION DATE:  03/01/2019 REFERRING MD:  Neurosurgery, CHIEF COMPLAINT:  weakness  Brief History   Donna Delgado is a 63 year old woman with no reported PMH, presented on 6/28 with subacute on chronic R arm weakness, found to have a 3.4 cm  frontoparietal enhancing mass with compression and vasogenic edema.  She was taken to OR for tumor exision 7/2 complicated by post op hematoma, taken back for evacuation same day.    Past Medical History  No Known hx.  Emphysema on CT chest [place] [facility] Events   Neurosurgery 7/2   Consults:  PCCM 7/2  Procedures:  7/2 brain tumor excision 7/2 hematoma evacuation  Significant Diagnostic Tests:  7/2 CT Head- 1. Regressed intracranial mass effect - including restored basilar cistern patency - following evacuation of postoperative hematoma. 2. Residual gas and blood in the resection cavity and residual rightward midline shift of 12-14 mm (previously 18 mm). 3. Stable mild asymmetric enlargement of the right lateral ventricle.   7/3 MRI >>Patient has had left frontoparietal craniotomy for tumor resection and subsequently for hematoma evacuation. Residual hematoma on the order of 5.5 cm in diameter. This study does not show any tissue that enhances additionally after today's gadolinium administration. T1 bright tissue in the region of surgery prior to contrast administration is felt to represent blood products.   Mass effect with left-to-right shift of 11 mm, reduced from most recent head CT where it measured 13 mm.  Plan for MRI 02/20/2019>> no visible change in operative bed region left frontal parietal brain.  Vasogenic edema is is noted.  Possible perioperative infarction.  Mass-effect with left to right shift 11 mm.  Questionable vasogenic event.   Micro Data:  MRSA surveillance negative SARSCoV2 negative  Sputum -  GNR 7/11  Antimicrobials:  Cefepime 7/10.  Interim history/subjective:  No significant change in neurological status.  Spoke to family at length and they have been advised that we have reached the limit of useful medical intervention.  They have requested time to process this information.  Objective   Blood pressure (!) 156/84, pulse 90, temperature 98.6 F (37 C), temperature source Axillary, resp. rate 19, height 5\' 3"  (1.6 m), weight 52.8 kg, SpO2 100 %.    Vent Mode: PRVC FiO2 (%):  [30 %] 30 % Set Rate:  [16 bmp] 16 bmp Vt Set:  [410 mL] 410 mL PEEP:  [5 cmH20] 5 cmH20 Pressure Support:  [10 cmH20] 10 cmH20 Plateau Pressure:  [10 cmH20-12 cmH20] 10 cmH20   Intake/Output Summary (Last 24 hours) at 02/23/2019 1257 Last data filed at 02/23/2019 4696 Gross per 24 hour  Intake 252 ml  Output 950 ml  Net -698 ml   Filed Weights   02/20/19 0500 02/22/19 0500 02/23/19 0500  Weight: 52.8 kg 53.1 kg 52.8 kg    Examination: General: 63 year old female no significant change in neurological status HEENT: Downward gaze preference, left craniotomy incision well approximated Neuro: Extends to stimulus CV: Heart sounds are regular PULM: even/non-labored, lungs bilaterally diminished in the bases EX:BMWU, non-tender, bsx4 active  Extremities: warm/dry, negative edema, foot drop noted Skin: no rashes or lesions  02/21/2019 chest x-ray support apparatus in appropriate position no acute abnormalities. Resolved Hospital Problem list   N/A  Assessment & Plan:  Critically ill due to respiratory failure requiring mechanical ventilation.  Patient has failed weaning due to tachypnea and mental  status precludes extubation.  Continue full ventilatory support.  Persistent severe encephalopathy due to devastating neurological injury following attempted tumor resection.  Best recovery would leave patient aphasic and hemiplegic.  Metastatic stage IV lung cancer  Uncurable at this stage and  not a candidate for therapy given poor performance status.  Best practice:  Diet: NPO. TF per recs today   Pain/Anxiety/Delirium protocol (if indicated):  prn analgesia VAP protocol (if indicated): yes DVT prophylaxis: SCDs. No lovenox d/t bleeding GI prophylaxis: PPI Glucose control: SSI Mobility: bed rest  Code Status: Full Family Communication: Per neurosurgery.  Per neurosurgery's note prognosis is grim.  Ongoing discussion with family concerning poor prognosis and little of no chance of any meaningful recovery.  Palliative care involved and a further meeting due for 17/12 Disposition: ICU  CRITICAL CARE Performed by: Kipp Brood  Total critical care time: 30 minutes  Critical care time was exclusive of separately billable procedures and treating other patients.  Critical care was necessary to treat or prevent imminent or life-threatening deterioration.  Critical care was time spent personally by me on the following activities: development of treatment plan with patient and/or surrogate as well as nursing, discussions with consultants, evaluation of patient's response to treatment, examination of patient, obtaining history from patient or surrogate, ordering and performing treatments and interventions, ordering and review of laboratory studies, ordering and review of radiographic studies, pulse oximetry, re-evaluation of patient's condition and participation in multidisciplinary rounds.  Kipp Brood, MD [facility] ICU Physician [place]  Pager: [phone] Mobile: [phone] After hours: [phone].   02/23/2019, 12:57 PM

## 2019-02-23 NOTE — Progress Notes (Signed)
Subjective: Patient resting in bed, continues on ventilator via ETT.  Objective: Vital signs in last 24 hours: Vitals:   02/23/19 0500 02/23/19 0600 02/23/19 0700 02/23/19 0800  BP: 133/73 101/62 133/83 (!) 182/95  Pulse: 90 74 88 95  Resp: (!) 21 16 (!) 21 (!) 24  Temp:    98.6 F (37 C)  TempSrc:    Axillary  SpO2: 99% 98% 99% 100%  Weight: 52.8 kg     Height:        Intake/Output from previous day: 07/10 0701 - 07/11 0700 In: 587.1 [NG/GT:522; IV Piggyback:65.1] Out: 950 [Urine:950] Intake/Output this shift: No intake/output data recorded.  Physical Exam: Not opening eyes to voice or gentle stimulation.  Decerebrate to stimulation.  CBC Recent Labs    02/22/19 0540 02/23/19 0546  WBC 17.4* 16.8*  HGB 9.2* 9.9*  HCT 29.3* 32.9*  PLT 281 339   BMET Recent Labs    02/22/19 0540 02/23/19 0546  NA 150* 153*  K 3.3* 3.8  CL 116* 119*  CO2 24 22  GLUCOSE 132* 83  BUN 21 22  CREATININE 0.60 0.70  CALCIUM 8.4* 8.3*    Assessment/Plan: Neurologically without change.  Continuing supportive care.   Hosie Spangle, MD 02/23/2019, 8:41 AM '

## 2019-02-24 DIAGNOSIS — Z7189 Other specified counseling: Secondary | ICD-10-CM | POA: Diagnosis not present

## 2019-02-24 DIAGNOSIS — G9389 Other specified disorders of brain: Secondary | ICD-10-CM | POA: Diagnosis not present

## 2019-02-24 DIAGNOSIS — J9601 Acute respiratory failure with hypoxia: Secondary | ICD-10-CM | POA: Diagnosis not present

## 2019-02-24 DIAGNOSIS — Z515 Encounter for palliative care: Secondary | ICD-10-CM | POA: Diagnosis not present

## 2019-02-24 LAB — GLUCOSE, CAPILLARY
Glucose-Capillary: 81 mg/dL (ref 70–99)
Glucose-Capillary: 91 mg/dL (ref 70–99)
Glucose-Capillary: 81 mg/dL (ref 70–99)

## 2019-02-24 LAB — CULTURE, RESPIRATORY W GRAM STAIN: Special Requests: NORMAL

## 2019-02-24 LAB — MAGNESIUM: Magnesium: 2.5 mg/dL — ABNORMAL HIGH (ref 1.7–2.4)

## 2019-02-24 LAB — PHOSPHORUS: Phosphorus: 2.8 mg/dL (ref 2.5–4.6)

## 2019-02-24 MED ORDER — MORPHINE 100MG IN NS 100ML (1MG/ML) PREMIX INFUSION
4.0000 mg/h | INTRAVENOUS | Status: DC
  Administered 2019-02-24 – 2019-02-26 (×3): 4 mg/h via INTRAVENOUS
  Filled 2019-02-24 (×3): qty 100

## 2019-02-24 MED ORDER — GLYCOPYRROLATE 0.2 MG/ML IJ SOLN: 0.2000 mg | INTRAMUSCULAR | Status: DC | PRN

## 2019-02-24 MED ORDER — SODIUM CHLORIDE 0.9 % IV BOLUS: 500.0000 mL | Freq: Once | INTRAVENOUS | Status: DC

## 2019-02-24 MED ORDER — KETOROLAC TROMETHAMINE 15 MG/ML IJ SOLN: 15.0000 mg | Freq: Four times a day (QID) | INTRAMUSCULAR | Status: DC | PRN

## 2019-02-24 MED ORDER — GLYCOPYRROLATE 0.2 MG/ML IJ SOLN
0.4000 mg | INTRAMUSCULAR | Status: DC
  Administered 2019-02-24 – 2019-02-27 (×12): 0.4 mg via INTRAVENOUS
  Filled 2019-02-24 (×13): qty 2

## 2019-02-24 MED ORDER — MIDAZOLAM HCL 2 MG/2ML IJ SOLN
2.0000 mg | INTRAMUSCULAR | Status: DC | PRN
  Administered 2019-02-24: 2 mg via INTRAVENOUS
  Filled 2019-02-24: qty 4

## 2019-02-24 MED ORDER — MORPHINE BOLUS VIA INFUSION
4.0000 mg | INTRAVENOUS | Status: DC | PRN
  Filled 2019-02-24: qty 10

## 2019-02-24 NOTE — Progress Notes (Signed)
  Palliative Medicine Progress Note  Pt seen, chart reviewed. Pt's dtr and uncle on the unit to withdraw, pursue comfort care Explained the process, usage of medications to ensure comfort. Dr. Lynetta Mare present and answered all questions for family as well as offering emotional support.  Pt's blood pressure has begun to drop today, spiking low grade fevers, posturing intermittently  Attempted to prepare family that pt likely will dies within hours to days. Patient Profile: 63 y.o. female no known past medical history admitted on 01/28/2019 with right arm weakness, right leg weakness.  Patient reported that she has been experiencing symptoms off and on for the past 4 months beginning with right hand weakness where she was dropping objects.  This progressed to right foot drop, falls, gait disturbance.  Had been home caring for her ill mother who had AQTMA-26 and subsequently died in 12-30-2022.  Her father has dementia. Patient was found per CT to have a left frontotemporal mass with large area of associated edema and mass-effect.  MRI performed and showed 3.4 cm mass with a 4 mm shift as well as pulmonary nodules to right lung.  Neurosurgery was consulted.  Resection of frontoparietal tumor was done on 02/17/2019.  This was complicated by bleeding and required reoperation, hematoma evacuation.  Since surgery she has remained ventilator dependent with profound neurological deficits.  She is posturing and unresponsive  Consult ordered for goals of care in the setting of tumor resection with poor neurological recovery and metastatic disease. Family has elected comfort with plan to liberate pt from the vent  Plan One way extubation  Anticipate hospital death Start MS04 gtt 1-10/hr and 4-10 q15 min PRN for pain or dyspnea Versed 2-4 q 87min PRN for agitation. MOnitor for need for continuous infusion Start sch robinul 0.4 mg q4 ATC and 0.2mg  q4 PRN secretions Zofran and phenergan PRN available for nausea Order  for extubation to 2L [place] Will DC abx, non-comfort care related medications Chaplain paged to bedside  Prognosis Hours to days   Disposition Anticipate hospital death  Thank you, Romona Curls, ANP Total Time: 80 min Extended billing time Time In: 1400 Time Out: 1520

## 2019-02-24 NOTE — Progress Notes (Signed)
Subjective: Patient resting in bed, continues on ventilator via ETT.  Objective: Vital signs in last 24 hours: Vitals:   02/23/19 2000 02/23/19 2333 02/24/19 0000 02/24/19 0343  BP: 125/69 (!) 94/55  118/64  Pulse: 88 77  88  Resp: (!) 22 20  20   Temp: 100 F (37.8 C)  99.5 F (37.5 C)   TempSrc: Oral  Oral   SpO2: 100% 100%  99%  Weight:      Height:        Intake/Output from previous day: 07/11 0701 - 07/12 0700 In: 200 [IV Piggyback:200] Out: -  Intake/Output this shift: Total I/O In: 100 [IV Piggyback:100] Out: -   Physical Exam: Not opening eyes to voice or gentle stimulation.  Spontaneous decerebration on right, minimal movement on left.  CBC Recent Labs    02/22/19 0540 02/23/19 0546  WBC 17.4* 16.8*  HGB 9.2* 9.9*  HCT 29.3* 32.9*  PLT 281 339   BMET Recent Labs    02/22/19 0540 02/23/19 0546  NA 150* 153*  K 3.3* 3.8  CL 116* 119*  CO2 24 22  GLUCOSE 132* 83  BUN 21 22  CREATININE 0.60 0.70  CALCIUM 8.4* 8.3*    Studies/Results: Dg Chest Port 1 View  Result Date: 02/23/2019 CLINICAL DATA:  Respiratory failure. Patient ventilator. Follow-up exam. EXAM: PORTABLE CHEST 1 VIEW COMPARISON:  02/21/2019 and older exams. FINDINGS: Lungs are hyperexpanded but remain clear. Endotracheal tube, nasal/orogastric tube and right PICC are stable in well positioned. IMPRESSION: No acute cardiopulmonary disease. Stable well-positioned support apparatus. Electronically Signed   By: Lajean Manes M.D.   On: 02/23/2019 10:08    Assessment/Plan: Minimal neurologic response persists.  Continuing supportive care.   Hosie Spangle, MD 02/24/2019, 4:07 AM

## 2019-02-24 NOTE — Procedures (Signed)
Extubation Procedure Note  Patient Details:   Name: Donna Delgado DOB: [birth year] MRN: [id number]   Airway Documentation:  Airway 7 mm (Active)  Secured at (cm) 22 cm 02/24/19 1043  Measured From Lips 02/24/19 1043  Secured Location Right 02/24/19 1043  Secured By [employer] 02/24/19 1043  Tube Holder Repositioned Yes 02/24/19 1043  Cuff Pressure (cm H2O) 25 cm H2O 02/24/19 0810  Site Condition Dry 02/24/19 1043   Vent end date: (not recorded) Vent end time: (not recorded)   Evaluation  O2 sats: stable throughout Complications: No apparent complications Patient did not tolerate procedure well. Bilateral Breath Sounds: Diminished   No   Pt terminally extubated and pt is a DNR and full comfort care.  RT will continue to monitor.  RN and NP at bedside with family.    Pierre Bali 02/24/2019, 3:57 PM

## 2019-02-24 NOTE — Consult Note (Signed)
Responded to palliative nurse's call, before planned terminal extubation of 89-y-o JJ:HERDE mother had recently died of covid, husband has dementia, and daughter and older brother were at bedside. Provided emotional/ spiritual/ grief support and prayer. In particular the prayers the creators of prayer shawls include with their gifts to mourners, and the beautiful shawl itself, seemed to mean a great deal to the daughter (especially being given in her mother's presence). She reached for it, then bent over the bed and sobbed into it for a long time. Her uncle thanked me much for coming, and for the prayers (to which daughter strongly nodded)/. I said I'd leave them to say their private good-byes, but if they needed a chaplain at any time, pls ask the nurse to call for one. I told palliative nurse the daughter did not yet seem ready to let her mother go, and they may not feel they need any further prayer at whatever time she does. And feel free to call whenever further chaplain services are desired. She was glad I'd brought the daughter the prayer shawl, and I was glad we had one to bring--thanks to the [place] who gather to make them for [place] to be able to  share something tangible with our pts at such desolate times as this.   Rev. Eloise Levels Chaplain

## 2019-02-24 NOTE — Progress Notes (Addendum)
NAME:  Donna Delgado, MRN:  [id number], DOB:  11-01-1955, LOS: 60 ADMISSION DATE:  02/05/2019, CONSULTATION DATE:  02/19/2019 REFERRING MD:  Neurosurgery, CHIEF COMPLAINT:  weakness  Brief History   Donna Delgado is a 63 year old woman with no reported PMH, presented on 6/28 with subacute on chronic R arm weakness, found to have a 3.4 cm  frontoparietal enhancing mass with compression and vasogenic edema.  She was taken to OR for tumor exision 7/2 complicated by post op hematoma, taken back for evacuation same day.   Past Medical History  No Known hx.  Emphysema on CT chest [place] [facility] Events   Neurosurgery 7/2   Consults:  PCCM 7/2  Procedures:  7/2 brain tumor excision 7/2 hematoma evacuation  Significant Diagnostic Tests:  7/2 CT Head- 1. Regressed intracranial mass effect - including restored basilar cistern patency - following evacuation of postoperative hematoma. 2. Residual gas and blood in the resection cavity and residual rightward midline shift of 12-14 mm (previously 18 mm). 3. Stable mild asymmetric enlargement of the right lateral ventricle.   7/3 MRI >>Patient has had left frontoparietal craniotomy for tumor resection and subsequently for hematoma evacuation. Residual hematoma on the order of 5.5 cm in diameter. This study does not show any tissue that enhances additionally after today's gadolinium administration. T1 bright tissue in the region of surgery prior to contrast administration is felt to represent blood products.   Mass effect with left-to-right shift of 11 mm, reduced from most recent head CT where it measured 13 mm.  Plan for MRI 02/20/2019>> no visible change in operative bed region left frontal parietal brain.  Vasogenic edema is is noted.  Possible perioperative infarction.  Mass-effect with left to right shift 11 mm.  Questionable vasogenic event.   Micro Data:  MRSA surveillance negative SARSCoV2 negative  Sputum -  GNR 7/11  Antimicrobials:  Cefepime 7/10.  Interim history/subjective:  No significant change in neurological status.  Spoke to family at length and they have been advised that we have reached the limit of useful medical intervention.  They have returned today for further goals of care discussion.  Objective   Blood pressure (!) 86/51, pulse 71, temperature 98 F (36.7 C), temperature source Axillary, resp. rate 16, height 5\' 3"  (1.6 m), weight 51.8 kg, SpO2 98 %.    Vent Mode: CPAP;PSV FiO2 (%):  [30 %] 30 % Set Rate:  [16 bmp] 16 bmp Vt Set:  [410 mL] 410 mL PEEP:  [5 cmH20] 5 cmH20 Pressure Support:  [8 cmH20-12 cmH20] 8 cmH20 Plateau Pressure:  [11 cmH20-16 cmH20] 11 cmH20   Intake/Output Summary (Last 24 hours) at 02/24/2019 1431 Last data filed at 02/24/2019 1400 Gross per 24 hour  Intake 500.07 ml  Output 75 ml  Net 425.07 ml   Filed Weights   02/22/19 0500 02/23/19 0500 02/24/19 0500  Weight: 53.1 kg 52.8 kg 51.8 kg    Examination: General: 63 year old female no significant change in neurological status HEENT: Downward gaze preference, left craniotomy incision well approximated Neuro: Extends to stimulus CV: Heart sounds are regular. Extremities warm, now more hypotensive. PULM: even/non-labored, lungs bilaterally diminished in the bases. No longer overbreathing ventilator. JJ:OACZ, non-tender, bsx4 active  Extremities: warm/dry, negative edema, foot drop noted Skin: no rashes or lesions  Resolved Hospital Problem list   N/A  Assessment & Plan:  Critically ill due to respiratory failure requiring mechanical ventilation.  Patient has failed weaning due to tachypnea and mental  status precludes extubation.  For compassionate extubation today.  Persistent severe encephalopathy due to devastating neurological injury following attempted tumor resection.  Best recovery would leave patient aphasic and hemiplegic.  Metastatic stage IV lung cancer  Uncurable at this  stage and not a candidate for therapy given poor performance status.  Best practice:  Diet: NPO. TF per recs today   Pain/Anxiety/Delirium protocol (if indicated):  prn analgesia VAP protocol (if indicated): yes DVT prophylaxis: SCDs. No lovenox d/t bleeding GI prophylaxis: PPI Glucose control: SSI Mobility: bed rest  Code Status: Full Family Communication: I have spoken again with her daughter Donna Delgado and emphasized the fatality of her mother's condition. We will transition to comfort care today.  Palliative  Care service input appreciated. Disposition: ICU  35 min spent with >50% of time in counseling and coordination of care.  Kipp Brood, MD [facility] ICU Physician [place]  Pager: [phone] Mobile: [phone] After hours: [phone].   02/24/2019, 2:31 PM

## 2019-02-25 DIAGNOSIS — G9340 Encephalopathy, unspecified: Secondary | ICD-10-CM | POA: Diagnosis not present

## 2019-02-25 DIAGNOSIS — G9389 Other specified disorders of brain: Secondary | ICD-10-CM | POA: Diagnosis not present

## 2019-02-25 MED ORDER — CHLORHEXIDINE GLUCONATE CLOTH 2 % EX PADS: 6.0000 | MEDICATED_PAD | Freq: Every day | CUTANEOUS | Status: DC

## 2019-02-25 NOTE — Progress Notes (Signed)
Verified with RN Dulcy Fanny that there was no morphine from bag needed to waste--all medication was in drip chamber prior to replacing empty morphine bag.

## 2019-02-25 NOTE — Progress Notes (Signed)
NAME:  Donna Delgado, MRN:  [id number], DOB:  August 03, 1956, LOS: 84 ADMISSION DATE:  02/12/2019, CONSULTATION DATE:  03/12/2019 REFERRING MD:  Neurosurgery, CHIEF COMPLAINT:  weakness  Brief History   Donna Delgado is a 63 year old woman with no reported PMH, presented on 6/28 with subacute on chronic R arm weakness, found to have a 3.4 cm  frontoparietal enhancing mass with compression and vasogenic edema.  She was taken to OR for tumor exision 7/2 complicated by post op hematoma, taken back for evacuation same day.   Past Medical History  Emphysema on CT chest Smoker  Significant Hospital Events   Neurosurgery 7/2   Consults:  PCCM 7/2  Procedures:  7/02 brain tumor excision 7/02 hematoma evacuation 7/12 Palliative extubation  Significant Diagnostic Tests:  7/2 CT Head >> Regressed intracranial mass effect - including restored basilar cistern patency - following evacuation of postoperative hematoma. Residual gas and blood in the resection cavity and residual rightward midline shift of 12-14 mm (previously 18 mm). Stable mild asymmetric enlargement of the right lateral ventricle.  MRI 7/3 >> Patient has had left frontoparietal craniotomy for tumor resection and subsequently for hematoma evacuation. Residual hematoma on the order of 5.5 cm in diameter. This study does not show any tissue that enhances additionally after today's gadolinium administration. T1 bright tissue in the region of surgery prior to contrast administration is felt to represent blood products. Mass effect with left-to-right shift of 11 mm, reduced from most recent head CT where it measured 13 mm.  MRI 7/8 >> no visible change in operative bed region left frontal parietal brain.  Vasogenic edema is is noted.  Possible perioperative infarction.  Mass-effect with left to right shift 11 mm.  Questionable vasogenic event.   Micro Data:  MRSA surveillance negative SARSCoV2 negative  Sputum 7/7 >> abundant  klebsiella >> S- ceftriaxone  Antimicrobials:  Cefepime 7/10 >>  Interim history/subjective:  RN reports pt's saturations have decreased to the 70's.  No evidence of discomfort.  Remains on morphine gtt.   Objective   Blood pressure (!) 97/56, pulse (!) 58, temperature 98 F (36.7 C), temperature source Axillary, resp. rate (!) 4, height 5\' 3"  (1.6 m), weight 51.8 kg, SpO2 93 %.    Vent Mode: CPAP;PSV FiO2 (%):  [30 %] 30 % PEEP:  [5 cmH20] 5 cmH20 Pressure Support:  [8 cmH20] 8 cmH20   Intake/Output Summary (Last 24 hours) at 02/25/2019 0813 Last data filed at 02/24/2019 1800 Gross per 24 hour  Intake 115.15 ml  Output 75 ml  Net 40.15 ml   Filed Weights   02/22/19 0500 02/23/19 0500 02/24/19 0500  Weight: 53.1 kg 52.8 kg 51.8 kg    Examination: General: adult female lying in bed  HEENT: MM pink/dry Neuro: obtunded, left cranial incision c/d/i - edges well approximated CV: s1s2 rrr, no m/r/g PULM: agonal respirations, rhonchi bilaterally  RX:VQMG, bsx4 hypoactive  Extremities: warm/dry, mild / trace generalized edema  Skin: no rashes or lesions  Resolved Hospital Problem list   Acute Respiratory Failure requiring Mechanical Ventilation due to Brain Mass, Lung Cancer  Assessment & Plan:   Metastatic Stage IV Lung Cancer  P: Palliative measures only, uncurable / not a candidate for therapy Morphine gtt for normal respiratory rate  Palliative care following, appreciate input  Likely hours to day Oral care per protocol  No further labs / radiographic studies   Persistent severe encephalopathy due to devastating neurological injury following attempted tumor resection.  P: Comfort measures as above  PRN robinul  Visitation for family if they want to be present     Best practice:  Diet: NPO  Pain/Anxiety/Delirium protocol (if indicated): Morphine gtt Code Status: DNR Family Communication: Per primary  Disposition: ICU   Noe Gens, NP-C [place]  Pulmonary & Critical Care Pgr: [phone] or if no answer [phone] 02/25/2019, 8:17 AM

## 2019-02-25 NOTE — Discharge Summary (Signed)
Discharge Summary  Date of Admission: 01/18/2019  Date of Discharge: 02/25/19  Attending Physician: Emelda Brothers, MD  Hospital Course: The patient was seen in the ED and admitted for right upper and lower extremity weakness. She had been caring for two of her family members and had unfortunately developed fairly significant weakness with some atrophy over the months leading up to presentation. A CT and then MRI showed a left frontoparietal enhancing mass, suspicious for a primary brain tumor. A CT CAP was performed, which showed no evidence of metastatic disease or extracranial primary malignancy. She was therefore taken to the OR on 02/26/2019 for a left craniotomy for resection of the tumor. Post-operatively, she recovered in PACU as expected with aphasia and right sided weakness. However, she starting becoming progressively less responsive and had left sided mydriasis. A stat repeat CTH confirmed the presence of a resection cavity hematoma. She was therefore emergently taken back to the OR for repeat craniotomy for hematoma evacuation. Post-operatively, she was kept intubated due to airway protection given her depressed neurologic exam. Her post-operative CT showed good evacuation of the hematoma with decreased midline shift. She was started on high dose dexamethasone and hypertonic saline to help decrease cerebral edema.   MRI on POD1 showed a gross total tumor resection with continued improvement of midline shift. She continued to have a neurologic exam significantly worse than expected. An EEG was obtained which showed diffuse slowing and some left posterior epileptiform discharges, but no seizures. Keppra was started empirically but unfortunately did not improve her exam. Neurology was consulted to look for alternative causes and a repeat MRI was performed on POD6. This showed multiple areas of diffusion restriction, including in the bilateral thalami, which were thought to be secondary to  increased ICPs during her immediate post-operative herniation event due to the hematoma. 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 The patient's poor neurologic exam and poor prognosis were discussed with the patient's family, who proceeded with terminal extubation. Palliative care was consulted and helped with symptom management as the patient was transitioned to comfort measures only. She was terminally extubated and died on 03-12-2019 at 03:49.   Discharge diagnosis: Metastatic lung cancer  Judith Part, MD 02/25/19 12:25 PM

## 2019-02-25 NOTE — Progress Notes (Signed)
Nutrition Brief Note  Chart reviewed. Pt now transitioning to comfort care.  No further nutrition interventions warranted at this time.  Please re-consult as needed.   Chaye Misch RD, LDN, CNSC [phone] Pager [phone] After Hours Pager    

## 2019-02-25 NOTE — Progress Notes (Signed)
Neurosurgery Service Progress Note  Subjective: No acute events overnight  Objective: Vitals:   02/25/19 0300 02/25/19 0400 02/25/19 0500 02/25/19 0600  BP:      Pulse: (!) 47 71 66 (!) 58  Resp: (!) 5 (!) 7 (!) 5 (!) 4  Temp:      TempSrc:      SpO2: 95% 94% 95% 93%  Weight:      Height:       Temp (24hrs), Avg:98.3 F (36.8 C), Min:98 F (36.7 C), Max:98.6 F (37 C)  CBC Latest Ref Rng & Units 02/23/2019 02/22/2019 02/20/2019  WBC 4.0 - 10.5 K/uL 16.8(H) 17.4(H) 15.3(H)  Hemoglobin 12.0 - 15.0 g/dL 9.9(L) 9.2(L) 9.7(L)  Hematocrit 36.0 - 46.0 % 32.9(L) 29.3(L) 30.4(L)  Platelets 150 - 400 K/uL 339 281 267   BMP Latest Ref Rng & Units 02/23/2019 02/22/2019 02/22/2019  Glucose 70 - 99 mg/dL 83 132(H) -  BUN 8 - 23 mg/dL 22 21 -  Creatinine 0.44 - 1.00 mg/dL 0.70 0.60 -  Sodium 135 - 145 mmol/L 153(H) 150(H) 150(H)  Potassium 3.5 - 5.1 mmol/L 3.8 3.3(L) -  Chloride 98 - 111 mmol/L 119(H) 116(H) -  CO2 22 - 32 mmol/L 22 24 -  Calcium 8.9 - 10.3 mg/dL 8.3(L) 8.4(L) -    Intake/Output Summary (Last 24 hours) at 02/25/2019 0702 Last data filed at 02/24/2019 1800 Gross per 24 hour  Intake 115.15 ml  Output 75 ml  Net 40.15 ml    Current Facility-Administered Medications:  .  0.9 %  sodium chloride infusion, , Intravenous, Continuous, Brennyn Ortlieb, Joyice Faster, MD, Stopped at 02/21/19 1549 .  chlorhexidine gluconate (MEDLINE KIT) (PERIDEX) 0.12 % solution 15 mL, 15 mL, Mouth Rinse, BID, Deterding, Guadelupe Sabin, MD, 15 mL at 02/24/19 0850 .  diphenhydrAMINE (BENADRYL) injection 25 mg, 25 mg, Intravenous, Q4H PRN, Agarwala, Ravi, MD .  glycopyrrolate (ROBINUL) injection 0.2 mg, 0.2 mg, Intravenous, Q4H PRN, Bullard, Elray Mcgregor, NP .  glycopyrrolate (ROBINUL) injection 0.4 mg, 0.4 mg, Intravenous, Q4H, Bullard, Elray Mcgregor, NP, 0.4 mg at 02/25/19 0349 .  morphine 166m in NS 1037m(54m53mL) infusion - premix, 1-10 mg/hr, Intravenous, Continuous, Bullard, SarElray McgregorP, Last Rate: 4 mL/hr  at 02/24/19 1800, 4 mg/hr at 02/24/19 1800 .  morphine bolus via infusion 4-10 mg, 4-10 mg, Intravenous, Q15 min PRN, Bullard, SarElray McgregorP   Physical Exam: Intubated, no sedation, lifts eyelids but doesn't open eyes to stim, PERRL, extending spontaneously in RUE / BLE, extension in LUE Incision c/d/i  Assessment and Plan: [age]o. 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 7/2 s/p craniotomy for tumor resection, post-op normal recovery from ETGA then progressive loss of exam, CTH w/ large post-op hematoma s/p emergent clot evac. 7/3 post-op CTH improved, 7/4 MRI with GTR, improved midline shift, 7/5 rpt CTH with continued decrease in midline shift, 7/9 rpt MRI with diffuse diffusion changes including bilateral thalamus, likely sequelae of herniation from expansion of hematoma. 7/12 terminally extubated after family discussions  -poor neurologic prognosis unfortunately, s/p terminal extubation, comfort care measures  ThoJudith Part7/13/20 7:02 AM

## 2019-02-25 NOTE — Progress Notes (Signed)
Palliative Medicine RN Note: Daily symptom check. Patient unresponsive. LUE cool and pulseless. Pulse 72, RR 8. PAINAD zero; managed by morphine infusion.   Discussed patient with Dr Hilma Favors. Due to high risk of sudden herniation, she does not feel it is safe to move Donna Delgado to another facility unless the family is willing to risk death in the ambulance. She does not support a transition of this nature.  No needs for medication or treatment adjustments at this time per Dr Hilma Favors.  Marjie Skiff Terrez Ander, RN, BSN, [facility] Palliative Medicine Team 02/25/2019 1:48 PM Office [phone]

## 2019-02-25 NOTE — Progress Notes (Signed)
Informed pt's daughter [name] of transfer to [phone].

## 2019-02-26 MED ORDER — LORAZEPAM 2 MG/ML IJ SOLN: 2.0000 mg | INTRAMUSCULAR | Status: DC | PRN

## 2019-02-26 MED ORDER — LORAZEPAM 2 MG/ML IJ SOLN
2.0000 mg | INTRAMUSCULAR | Status: DC
  Administered 2019-02-26 – 2019-02-27 (×4): 2 mg via INTRAVENOUS
  Filled 2019-02-26 (×4): qty 1

## 2019-02-26 MED ORDER — ACETAMINOPHEN 650 MG RE SUPP
650.0000 mg | Freq: Four times a day (QID) | RECTAL | Status: DC | PRN
  Administered 2019-02-26: 650 mg via RECTAL
  Filled 2019-02-26: qty 1

## 2019-02-26 NOTE — Plan of Care (Signed)
  Problem: Role Relationship: Goal: Family's ability to cope with current situation will improve Outcome: Progressing   

## 2019-02-26 NOTE — Progress Notes (Signed)
Neurosurgery Service Progress Note  Subjective: terminally extubated yesterday  Objective: Vitals:   02/25/19 1400 02/25/19 1500 02/25/19 1637 02/26/19 0522  BP:   105/61 96/63  Pulse: 68 65 81 (!) 108  Resp: (!) 9 10 12 18   Temp:    99 F (37.2 C)  TempSrc:    Oral  SpO2: 90% 91% 95% (!) 82%  Weight:      Height:       Temp (24hrs), Avg:99 F (37.2 C), Min:99 F (37.2 C), Max:99 F (37.2 C)  CBC Latest Ref Rng & Units 02/23/2019 02/22/2019 02/20/2019  WBC 4.0 - 10.5 K/uL 16.8(H) 17.4(H) 15.3(H)  Hemoglobin 12.0 - 15.0 g/dL 9.9(L) 9.2(L) 9.7(L)  Hematocrit 36.0 - 46.0 % 32.9(L) 29.3(L) 30.4(L)  Platelets 150 - 400 K/uL 339 281 267   BMP Latest Ref Rng & Units 02/23/2019 02/22/2019 02/22/2019  Glucose 70 - 99 mg/dL 83 132(H) -  BUN 8 - 23 mg/dL 22 21 -  Creatinine 0.44 - 1.00 mg/dL 0.70 0.60 -  Sodium 135 - 145 mmol/L 153(H) 150(H) 150(H)  Potassium 3.5 - 5.1 mmol/L 3.8 3.3(L) -  Chloride 98 - 111 mmol/L 119(H) 116(H) -  CO2 22 - 32 mmol/L 22 24 -  Calcium 8.9 - 10.3 mg/dL 8.3(L) 8.4(L) -    Intake/Output Summary (Last 24 hours) at 02/26/2019 0921 Last data filed at 02/25/2019 1639 Gross per 24 hour  Intake 63.77 ml  Output 400 ml  Net -336.23 ml    Current Facility-Administered Medications:  .  0.9 %  sodium chloride infusion, , Intravenous, Continuous, Kristyana Notte, Joyice Faster, MD, Stopped at 02/21/19 1549 .  Chlorhexidine Gluconate Cloth 2 % PADS 6 each, 6 each, Topical, Daily, Tresean Mattix A, MD .  diphenhydrAMINE (BENADRYL) injection 25 mg, 25 mg, Intravenous, Q4H PRN, Agarwala, Ravi, MD .  glycopyrrolate (ROBINUL) injection 0.2 mg, 0.2 mg, Intravenous, Q4H PRN, Bullard, Elray Mcgregor, NP .  glycopyrrolate (ROBINUL) injection 0.4 mg, 0.4 mg, Intravenous, Q4H, Bullard, Elray Mcgregor, NP, 0.4 mg at 02/26/19 9150 .  morphine 100mg  in NS 145mL (1mg /mL) infusion - premix, 1-10 mg/hr, Intravenous, Continuous, Bullard, Elray Mcgregor, NP, Last Rate: 2 mL/hr at 02/26/19 0746, 2  mg/hr at 02/26/19 0746 .  morphine bolus via infusion 4-10 mg, 4-10 mg, Intravenous, Q15 min PRN, Bullard, Elray Mcgregor, NP   Physical Exam: Lying in bed, no visible signs of discomfort, bradypneic  Assessment and Plan: 63 y.o. 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 7/2 s/p craniotomy for tumor resection, post-op normal recovery from ETGA then progressive loss of exam, CTH w/ large post-op hematoma s/p emergent clot evac. 7/3 post-op CTH improved, 7/4 MRI with GTR, improved midline shift, 7/5 rpt CTH with continued decrease in midline shift, 7/9 rpt MRI with diffuse diffusion changes including bilateral thalamus, likely sequelae of herniation from expansion of hematoma. 7/12 terminally extubated after family discussions  -comfort care measures only. Pt currently appears very comfortable, had some hypoxia while she was still on the monitor. Continue morphine gtt, titrate as needed for comfort, f/u pal care recs  Judith Part  02/26/19 9:21 AM

## 2019-02-26 NOTE — Progress Notes (Addendum)
Palliative Medicine RN Note: Daily symptom check. RR around 22, HR 130s, ax temp 102.9. Completely unresponsive. Morphine was decreased to 2mg /hour this am (still within order parameters).  Obtained order for Tylenol suppository. Discussed case with Dr Hilma Favors. Patient is having symptoms that are not currently controlled. PMT remains concerned that Mileidy has a very, very short life expectancy. Requested RN increase infusion rate back to 4mg  and adjusted range order per Dr Hilma Favors d/t signs that pt is in distress (tachypnea, tachycardia).  Do not check sats. Do not titrate O2. Give benzos and/or opioids for shortness of breath, dyspnea, increased work of breathing or respiratory rate over 22.   Call PMT at [phone] for uncontrolled symptoms from 0700-1900. Outside of these hours, please contact primary team.  Marjie Skiff. Cherylann Ratel, RN, BSN, [facility] Palliative Medicine Team 02/26/2019 11:24 AM Office [phone]   ADDENDUM: Dr Hilma Favors called back with additional orders for lorazepam prn and scheduled d/t high risk of seizures. If she begins to seize, Dr Hilma Favors will consider a benzo continuous infusion.  Marjie Skiff Kaulana Brindle, RN, BSN, [facility] Palliative Medicine Team 02/26/2019 11:41 AM Office [phone]

## 2019-02-27 LAB — CULTURE, BLOOD (ROUTINE X 2)
Culture: NO GROWTH
Culture: NO GROWTH
Special Requests: ADEQUATE
Special Requests: ADEQUATE

## 2019-03-06 NOTE — Addendum Note (Signed)
Addendum  created 03/06/19 0857 by Lynda Rainwater, MD   Intraprocedure Staff edited

## 2019-03-16 NOTE — Progress Notes (Signed)
50 cc of IV Morphine wasted in stericycle. Witnessed by Precious Bard RN.

## 2019-03-16 NOTE — Progress Notes (Signed)
Called patient's daughter,Rachel, at [phone], to inform Donna Delgado of her mother's death. No one answered but I left message for Donna Delgado to call me back at [phone] or [phone].

## 2019-03-16 NOTE — Progress Notes (Signed)
Late entry- pt found not breathing,pulseless,unresponsive at 0349. Death pronounced by Alba Cory RN and Precious Bard RN.MD notified. Pt's daughter called;message left.

## 2019-03-16 NOTE — Progress Notes (Signed)
Called pt's daughter,Rachel, to inform her of her mother's death. No answer,but I didi leave a message at [phone]

## 2019-03-16 NOTE — Progress Notes (Signed)
Family in with deceased pt and took belongings with them.  Funeral home information obtained, chaplain in to see family.

## 2019-03-16 NOTE — Progress Notes (Signed)
I responded to a page from the nurse to provide spiritual and grief support for the patient's family. I visited the patient's room with her daughter and husband present. I shared words of comfort and led in prayer. I shared that the Chaplain is available to provide additional support as needed or requested.    Mar 01, 2019 0809  Clinical Encounter Type  Visited With Patient and family together  Visit Type Spiritual support;Death  Referral From Nurse  Consult/Referral To Chaplain  Spiritual Encounters  Spiritual Needs Prayer;Emotional;Grief support    Chaplain Dr Redgie Grayer

## 2019-03-16 DEATH — deceased

## 2019-10-17 IMAGING — CT CT HEAD WITHOUT CONTRAST
3 of 4 series · 13 of 47 positions shown, 15 images · non-contrast
Comparison: Initial postoperative CT 2552 hours today, and earlier.

CLINICAL DATA: 62-year-old female status post craniotomy and
resection of left hemisphere tumor earlier today, and subsequent
return to the operating room for increased intracranial mass effect.

EXAM:
CT HEAD WITHOUT CONTRAST
TECHNIQUE: Contiguous axial images were obtained from the base of the skull
through the vertex without intravenous contrast.

[Series 3: head wo · axial · 0.45mm/px · z∈[-122,+3]mm · 7 of 35 slices shown, 9 images]
[im 5/35  brain]
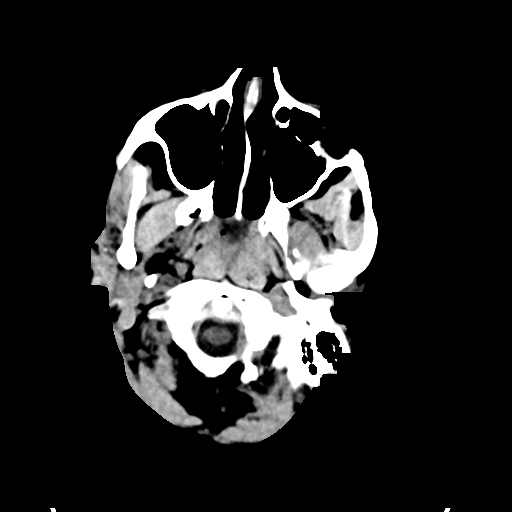
[im 5/35  bone]
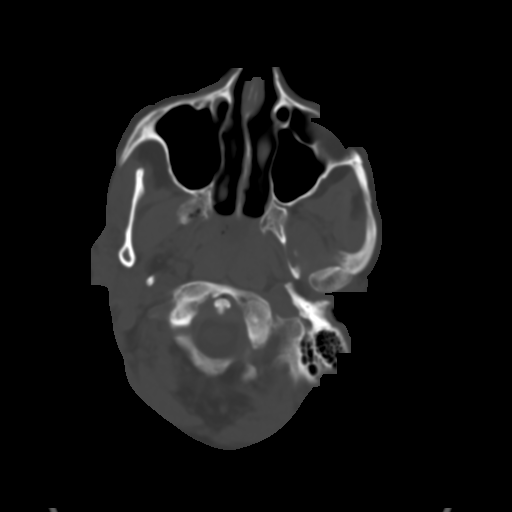
[im 9/35  brain]
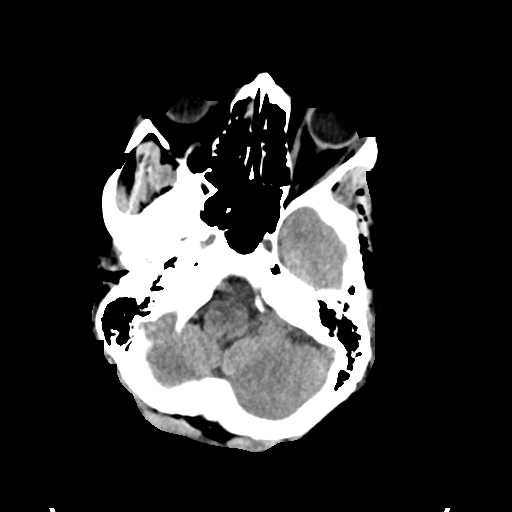
[im 13/35  brain]
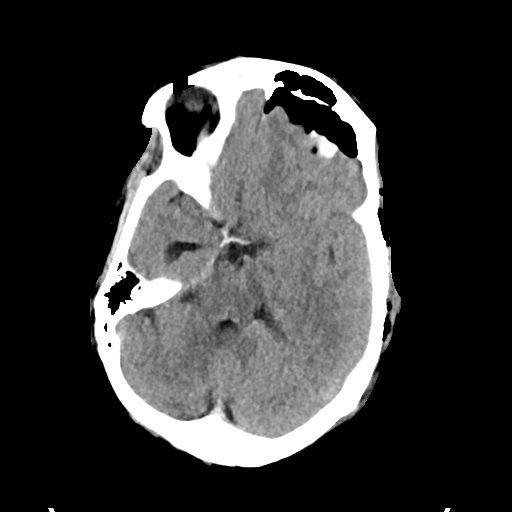
[im 18/35  brain]
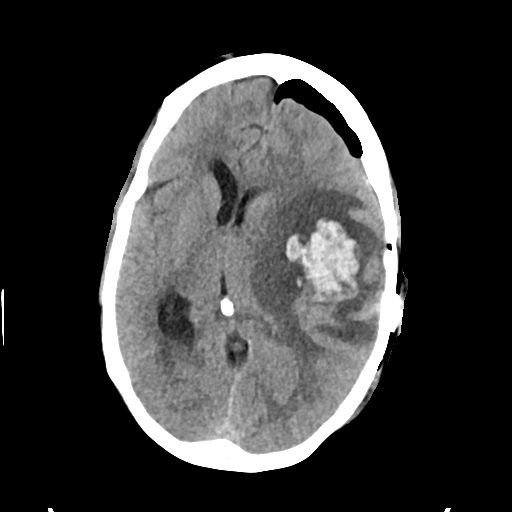
[im 22/35  brain]
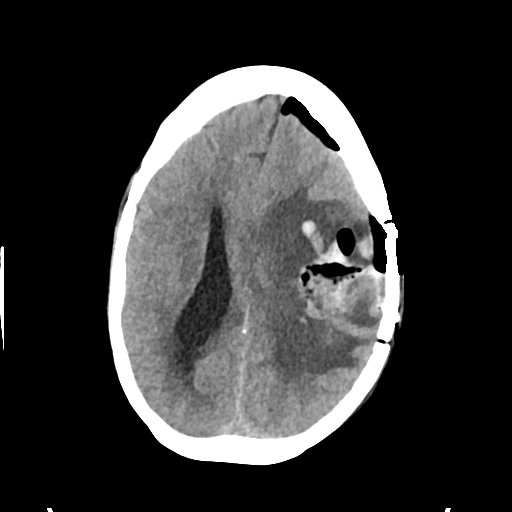
[im 22/35  bone]
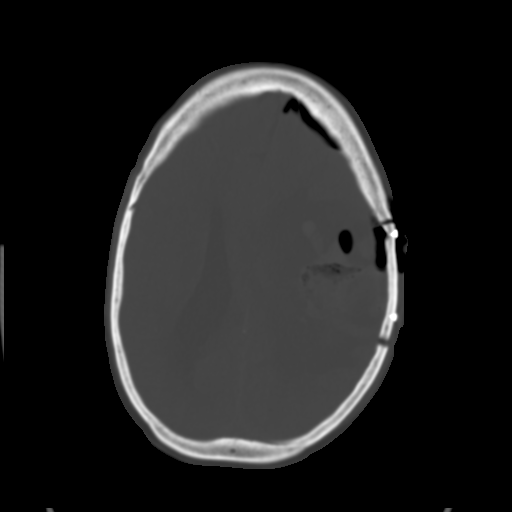
[im 26/35  brain]
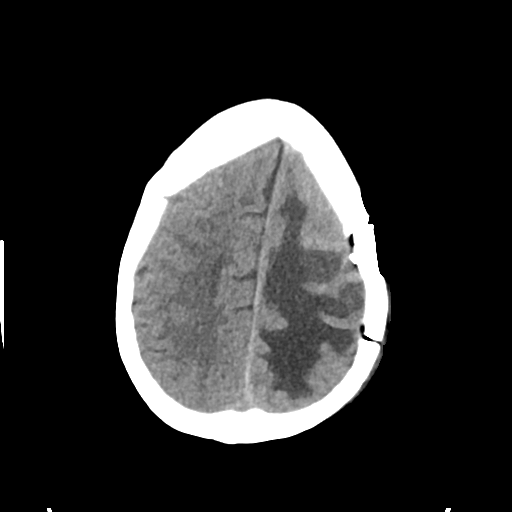
[im 30/35  brain]
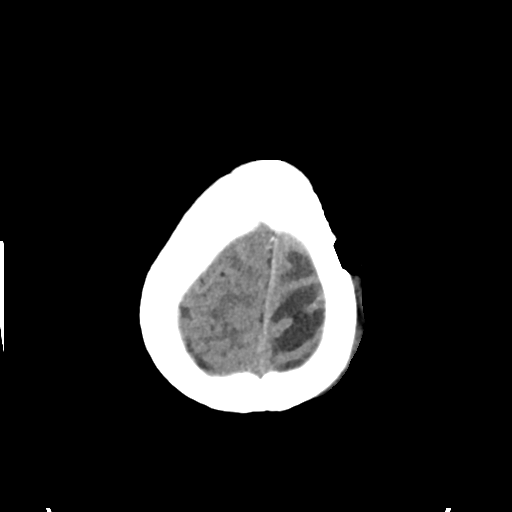

[Series 5: cor soft · coronal · 0.34mm/px · 3 of 78 slices shown]
[im 26/78  brain]
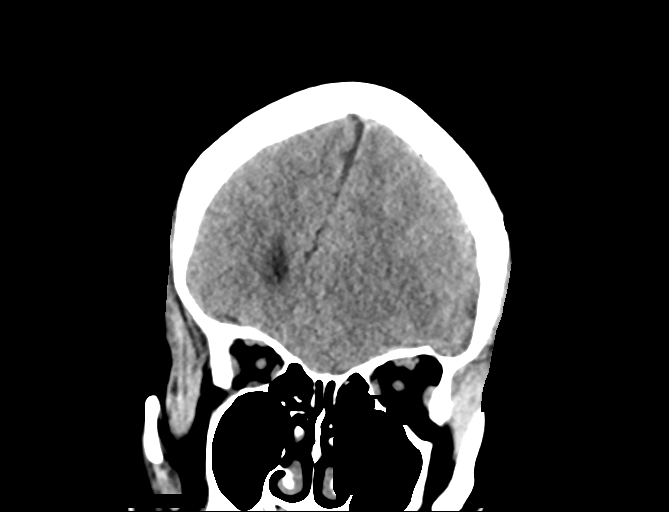
[im 35/78  brain]
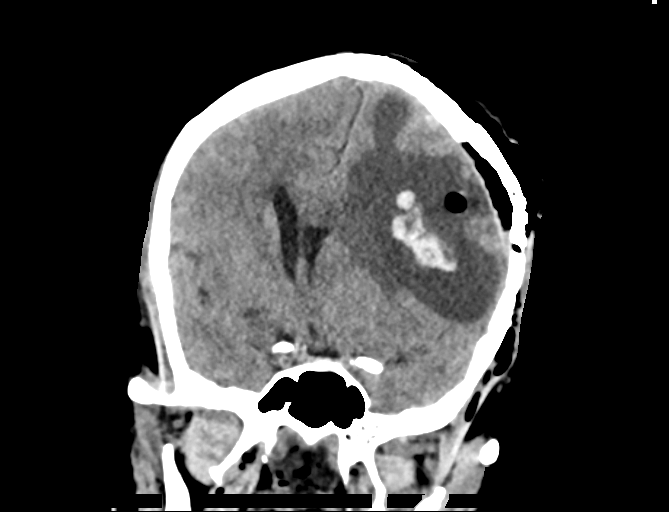
[im 43/78  brain]
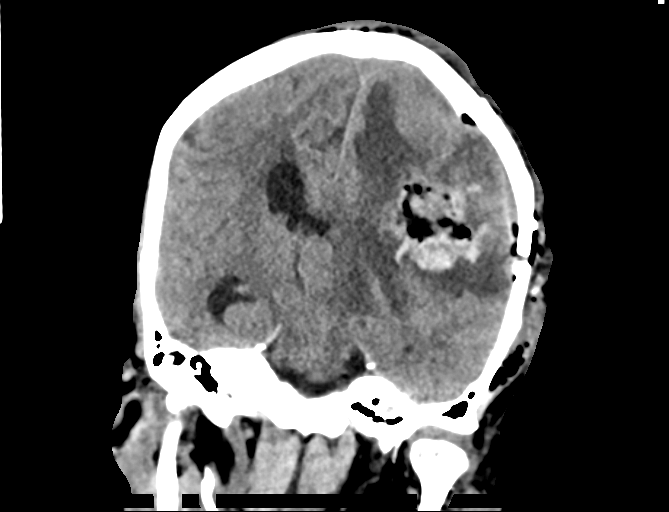

[Series 6: sag soft · sagittal · 0.34mm/px · 3 of 67 slices shown]
[im 23/67  brain]
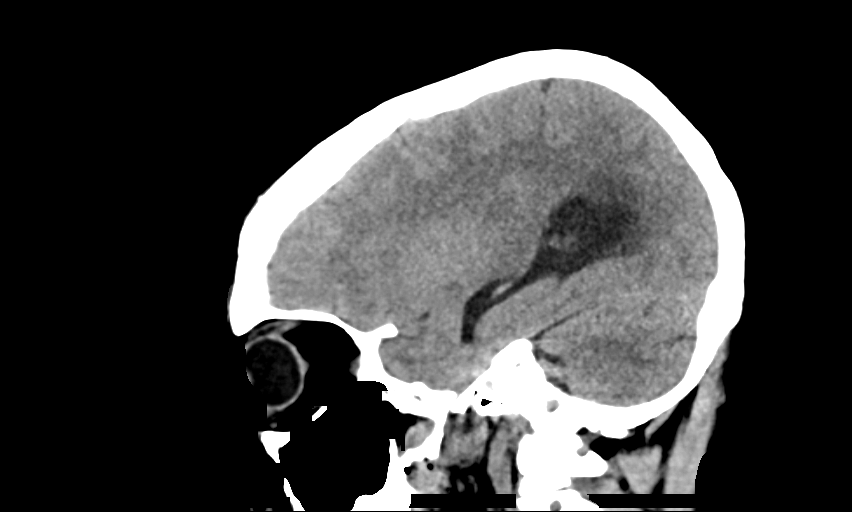
[im 34/67  brain]
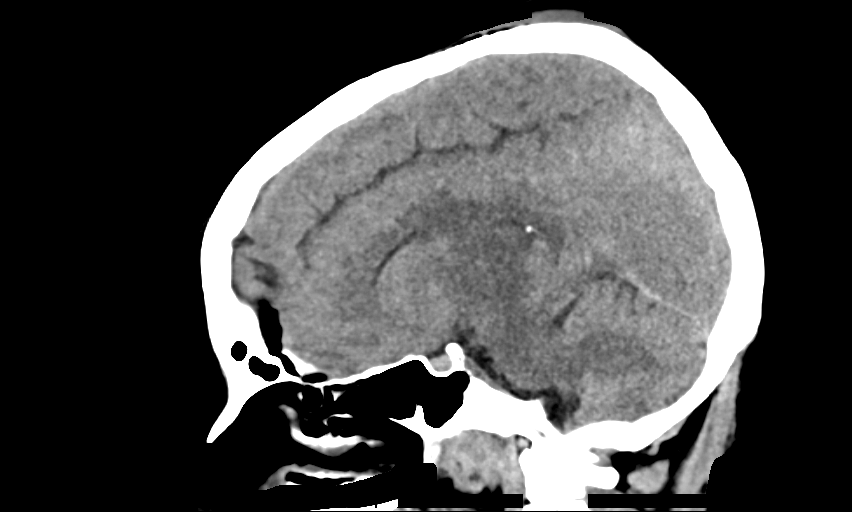
[im 45/67  brain]
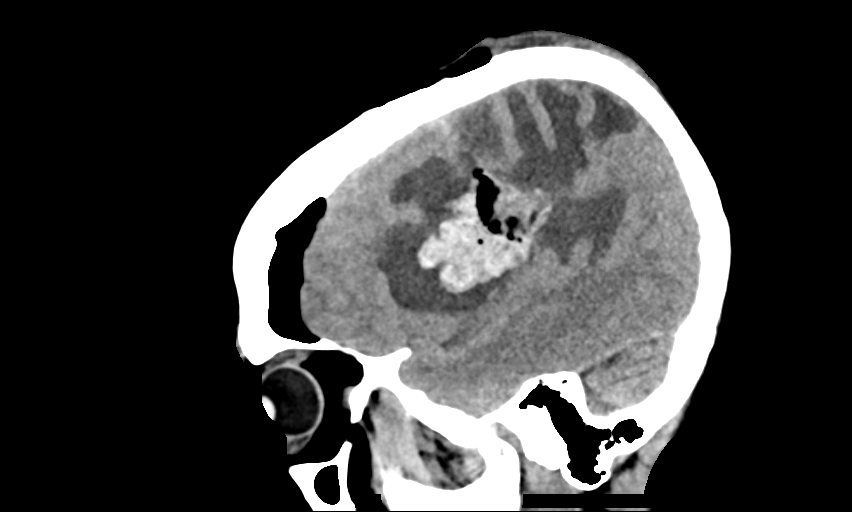

[13 of 47 positions shown; findings below may reference images not displayed]

FINDINGS: Brain: Mixed density left hemisphere resection cavity
redemonstrated. Interval decreased blood products in the resection
cavity. Stable to mildly increased pneumocephalus on the left.

Regressed intracranial mass effect. There remains rightward midline
shift, but this is now 12-14 millimeters (previously 18 millimeters)
and basilar cistern patency has been restored (sagittal image 32).
Still, there is mass effect on the lateral ventricles. Enlargement
of the right temporal horn is stable.

No significant change in nonenhancing tumor or edema in the left
hemisphere tracking into the deep white matter capsules. No
cortically based acute infarct identified.

Vascular: No suspicious intracranial vascular hyperdensity.

Skull: Left lateral craniotomy not significantly changed.

Sinuses/Orbits: Visualized paranasal sinuses and mastoids are stable
and well pneumatized.

Other: Postoperative changes to the scalp soft tissues along the
left convexity.
IMPRESSION: 1. Regressed intracranial mass effect - including restored basilar
cistern patency - following evacuation of postoperative hematoma.
2. Residual gas and blood in the resection cavity and residual
rightward midline shift of 12-14 mm (previously 18 mm).
3. Stable mild asymmetric enlargement of the right lateral
ventricle.
4. No new intracranial abnormality.

## 2019-10-24 IMAGING — CR PORTABLE CHEST - 1 VIEW
1 series · 1 of 1 positions shown · non-contrast
Comparison: 02/20/2019 and 02/18/2019

CLINICAL DATA: Respiratory failure. Endotracheal tube.

EXAM:
PORTABLE CHEST 1 VIEW

[AP]
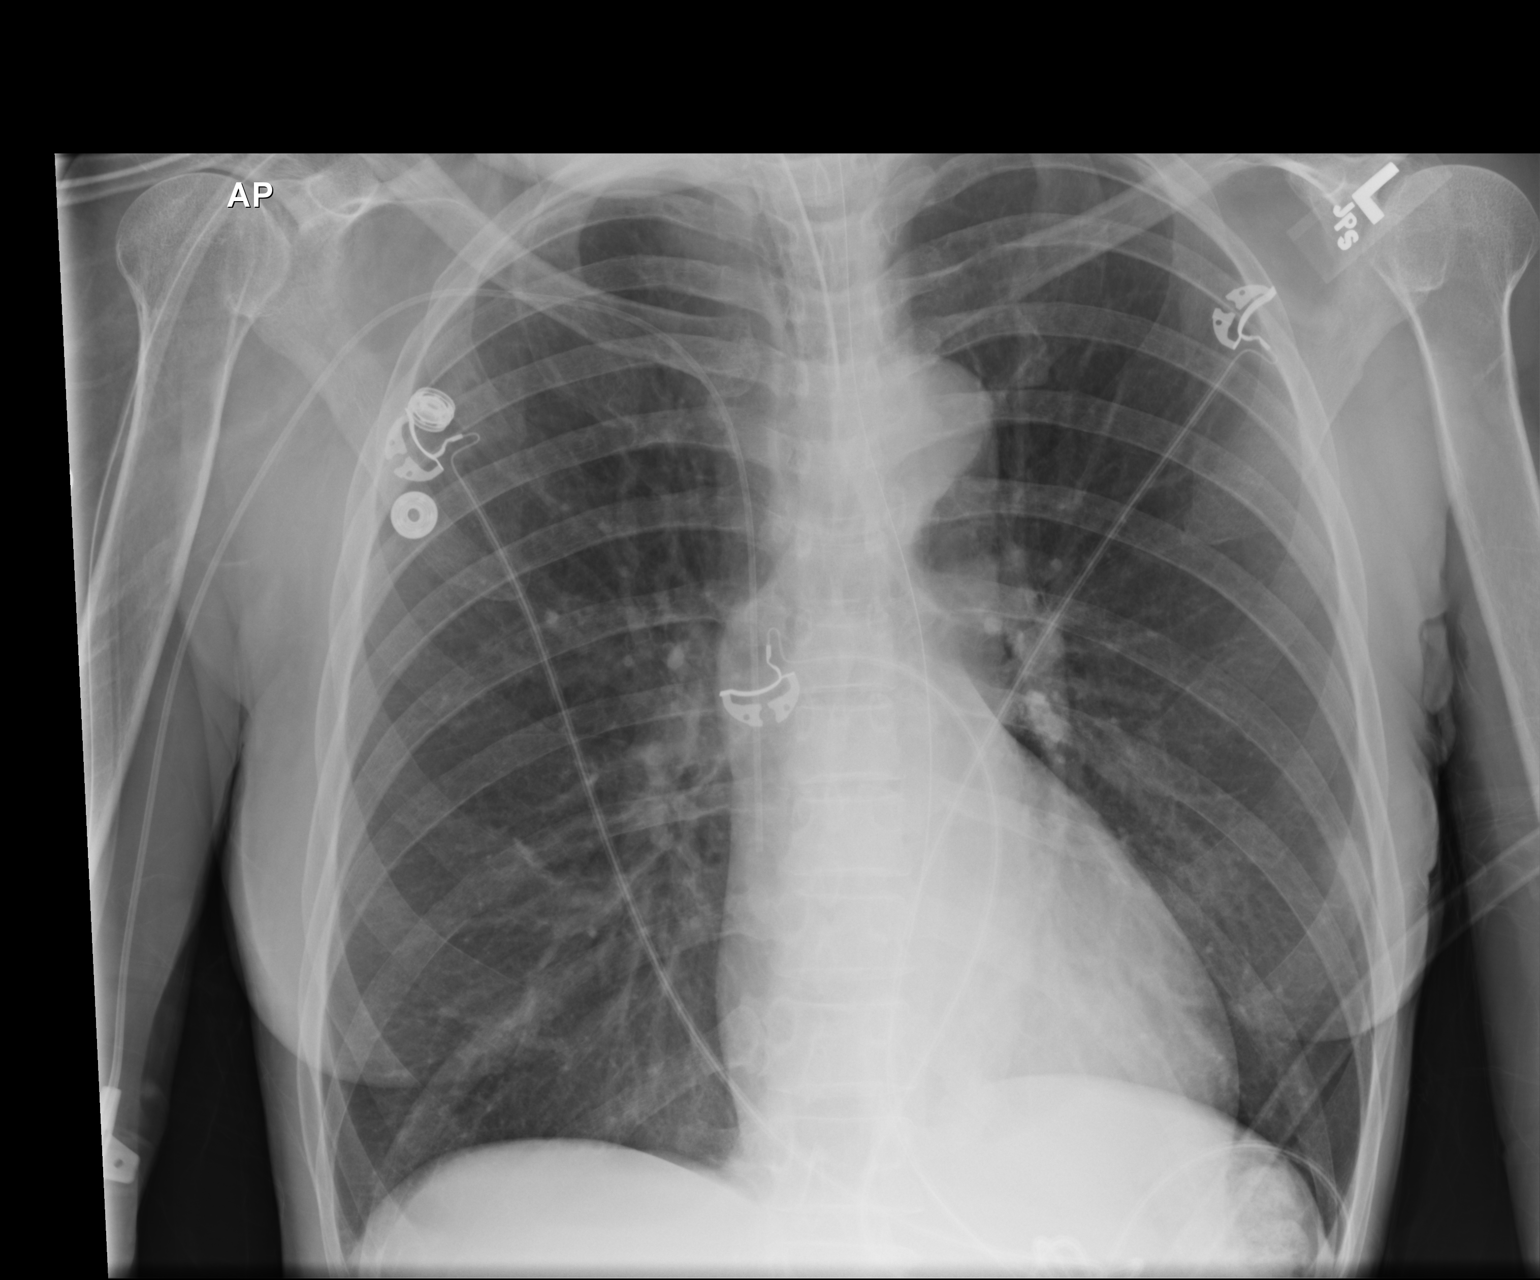

[1 of 1 positions shown; findings below may reference images not displayed]

FINDINGS: Endotracheal tube tip is 5.6 cm above the carina in good position.
NG tube tip is below the diaphragm. PICC tip is in the superior vena
cava above the cavoatrial junction, unchanged and in good position.

Heart size and pulmonary vascularity are normal. Lungs are clear.
IMPRESSION: 1. No acute cardiopulmonary disease.
2. Endotracheal tube tip is 5.6 cm above the carina in good
position.
